# Patient Record
Sex: Female | Born: 1995 | Race: Black or African American | Hispanic: No | Marital: Single | Smoking: Current some day smoker
Health system: Southern US, Community
[De-identification: ages and names within clinical notes are randomized; demographics above are authoritative.]

## PROBLEM LIST (undated history)

## (undated) DIAGNOSIS — N946 Dysmenorrhea, unspecified: Secondary | ICD-10-CM

## (undated) HISTORY — DX: Dysmenorrhea, unspecified: N94.6

---
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
## 2017-05-21 ENCOUNTER — Ambulatory Visit (INDEPENDENT_AMBULATORY_CARE_PROVIDER_SITE_OTHER): Payer: Medicaid Other | Admitting: Family Medicine

## 2017-05-21 ENCOUNTER — Encounter: Payer: Self-pay | Admitting: Family Medicine

## 2017-05-21 VITALS — BP 108/84 | HR 85 | Temp 98.5°F | Resp 18 | Ht 67.0 in | Wt 137.9 lb

## 2017-05-21 DIAGNOSIS — R6889 Other general symptoms and signs: Secondary | ICD-10-CM

## 2017-05-21 DIAGNOSIS — R112 Nausea with vomiting, unspecified: Secondary | ICD-10-CM

## 2017-05-21 MED ORDER — FLUTICASONE PROPIONATE 50 MCG/ACT NA SUSP: 2.0000 | Freq: Every day | NASAL | 0 refills | Status: DC

## 2017-05-21 MED ORDER — ONDANSETRON HCL 4 MG PO TABS: 4.0000 mg | ORAL_TABLET | Freq: Three times a day (TID) | ORAL | 0 refills | Status: DC | PRN

## 2017-05-21 MED ORDER — FEXOFENADINE HCL 180 MG PO TABS: 180.0000 mg | ORAL_TABLET | Freq: Every day | ORAL | 0 refills | Status: DC

## 2017-05-21 MED ORDER — SALINE SPRAY 0.65 % NA SOLN: 1.0000 | NASAL | 0 refills | Status: DC | PRN

## 2017-05-21 NOTE — Progress Notes (Signed)
Name: Martha Mercado   MRN: [id number]    DOB: 11/07/1995   Date:05/21/2017       Progress Note  Subjective  Chief Complaint  Chief Complaint  Patient presents with  . URI    congested, headache, nausea for 1 week    HPI  Pt sts 5 days ago woke up from sleep with chills, body aches and severe nasal congestion. Sts the next day she felt better but then go significantly worse the next day. Pt endorses headache and severe nasal congestion- sts has to blow her nose often. Pt endorses nausea and vomiting started yesterday.  Presently denies body aches, fever or chills, eye drainage, blurry vision, itchy eyes, sneezing, ear ache, muffled sounds, diarrhea, sore throat or cough. Pt sts has been taking Nyquil with moderate relief of symptoms. Pt has positive sick contact- bf has been sick with flu-like symptoms 2 weeks ago.    Patient Active Problem List   Diagnosis Date Noted  . Blood in stool 08/15/2016  . Ovarian cyst, right 08/15/2016  . Chlamydia infection 12/29/2014    Social History   Tobacco Use  . Smoking status: Never Smoker  . Smokeless tobacco: Never Used  Substance Use Topics  . Alcohol use: No    Alcohol/week: 0.0 oz     Current Outpatient Medications:  .  acetaminophen (TYLENOL 8 HOUR) 650 MG CR tablet, Take 1 tablet (650 mg total) by mouth every 8 (eight) hours as needed for pain., Disp: 10 tablet, Rfl: 0  No Known Allergies  ROS  Constitutional: Negative for fever or weight change.  HEENT: See HPI Respiratory: Negative for cough and shortness of breath.   Cardiovascular: Negative for chest pain or palpitations.  Gastrointestinal: Positive for abdominal pain, nausea vomting, denies bowel changes.  Musculoskeletal: Negative for gait problem or joint swelling.  Skin: Negative for rash.  Neurological: Positive for headaches Negative for dizziness, neck stiffness.  No other specific complaints in a complete review of systems (except as listed in HPI  above).  Objective  Vitals:   05/21/17 1023  BP: 108/84  Pulse: 85  Resp: 18  Temp: 98.5 F (36.9 C)  TempSrc: Oral  SpO2: 98%  Weight: 137 lb 14.4 oz (62.6 kg)  Height: 5\' 7"  (1.702 m)     Body mass index is 21.6 kg/m.  Nursing Note and Vital Signs reviewed.  Physical Exam  Constitutional: Patient appears well-developed and well-nourished.  No distress.  HEENT: head atraumatic, normocephalic, pupils equal and reactive to light, EOM's intact, TM's without erythema or bulging, no maxillary or frontal sinus tenderness , neck supple without lymphadenopathy, oropharynx pink and moist without exudate Cardiovascular: Normal rate, regular rhythm, S1/S2 present.  No murmur or rub heard. No BLE edema. Pulmonary/Chest: Effort normal and breath sounds clear. No respiratory distress or retractions. Abdominal: Soft and non-tender, bowel sounds present x4 quadrants.  No CVA Tenderness  Psychiatric: Patient has a normal mood and affect. behavior is normal. Judgment and thought content normal.  No results found for this or any previous visit (from the past 72 hour(s)).  Assessment & Plan  There are no diagnoses linked to this encounter. 1. Flu-like symptoms - ondansetron (ZOFRAN) 4 MG tablet; Take 1 tablet (4 mg total) by mouth every 8 (eight) hours as needed for nausea or vomiting.  Dispense: 10 tablet; Refill: 0 - fluticasone (FLONASE) 50 MCG/ACT nasal spray; Place 2 sprays into both nostrils daily.  Dispense: 16 g; Refill: 0 - sodium chloride (OCEAN) 0.65 %  SOLN nasal spray; Place 1 spray into both nostrils as needed for congestion.  Dispense: 88 mL; Refill: 0 - fexofenadine (ALLEGRA ALLERGY) 180 MG tablet; Take 1 tablet (180 mg total) by mouth daily.  Dispense: 30 tablet; Refill: 0  2. Non-intractable vomiting with nausea, unspecified vomiting type - ondansetron (ZOFRAN) 4 MG tablet; Take 1 tablet (4 mg total) by mouth every 8 (eight) hours as needed for nausea or vomiting.  Dispense:  10 tablet; Refill: 0   Pt to alternate with OTC acetaminophen and ibuprofen as needed for pain and fever/chills. Zofran is prescribed for you to take is needed for nausea. Drink plenty of fluids and rest for the next 24-48 hours. Wash your hands thoroughly and cover your nose when sneezing to prevent spreading infection. If you are not feeling better or feel worse in the next few days call back in or follow up with Urgent Care.   -Red flags and when to present for emergency care or RTC including fever >101.27F, chest pain, shortness of breath, new/worsening/un-resolving symptoms, reviewed with patient at time of visit. Follow up and care instructions discussed and provided in AVS.

## 2017-05-21 NOTE — Patient Instructions (Addendum)
You can take acetaminophen and ibuprofen every 8 hours as needed for pain and fever/chills.  Zofran is prescribed for you to take is needed for nausea. Drink plenty of fluids and rest for the next 24-48 hours. Wash your hands thoroughly and cover your nose when sneezing to prevent spreading infection. If you are not feeling better or feel worse in the next few days call back in or follow up with Urgent Care.    Upper Respiratory Infection, Adult Most upper respiratory infections (URIs) are caused by a virus. A URI affects the nose, throat, and upper air passages. The most common type of URI is often called "the common cold." Follow these instructions at home:  Take medicines only as told by your doctor.  Gargle warm saltwater or take cough drops to comfort your throat as told by your doctor.  Use a warm mist humidifier or inhale steam from a shower to increase air moisture. This may make it easier to breathe.  Drink enough fluid to keep your pee (urine) clear or pale yellow.  Eat soups and other clear broths.  Have a healthy diet.  Rest as needed.  Go back to work when your fever is gone or your doctor says it is okay. ? You may need to stay home longer to avoid giving your URI to others. ? You can also wear a face mask and wash your hands often to prevent spread of the virus.  Use your inhaler more if you have asthma.  Do not use any tobacco products, including cigarettes, chewing tobacco, or electronic cigarettes. If you need help quitting, ask your doctor. Contact a doctor if:  You are getting worse, not better.  Your symptoms are not helped by medicine.  You have chills.  You are getting more short of breath.  You have brown or red mucus.  You have yellow or brown discharge from your nose.  You have pain in your face, especially when you bend forward.  You have a fever.  You have puffy (swollen) neck glands.  You have pain while swallowing.  You have white areas  in the back of your throat. Get help right away if:  You have very bad or constant: ? Headache. ? Ear pain. ? Pain in your forehead, behind your eyes, and over your cheekbones (sinus pain). ? Chest pain.  You have long-lasting (chronic) lung disease and any of the following: ? Wheezing. ? Long-lasting cough. ? Coughing up blood. ? A change in your usual mucus.  You have a stiff neck.  You have changes in your: ? Vision. ? Hearing. ? Thinking. ? Mood. This information is not intended to replace advice given to you by your health care provider. Make sure you discuss any questions you have with your health care provider. Document Released: 08/21/2007 Document Revised: 11/05/2015 Document Reviewed: 06/09/2013 [employer]vier Interactive Patient Education  2018 [employer]

## 2018-11-14 ENCOUNTER — Emergency Department
Admission: EM | Admit: 2018-11-14 | Discharge: 2018-11-14 | Disposition: A | Discharge status: Other Acute Inpatient Hospital | Payer: Self-pay | Attending: Emergency Medicine | Admitting: Emergency Medicine

## 2018-11-14 ENCOUNTER — Encounter: Payer: Self-pay | Admitting: Emergency Medicine

## 2018-11-14 ENCOUNTER — Other Ambulatory Visit: Payer: Self-pay

## 2018-11-14 ENCOUNTER — Inpatient Hospital Stay
Admission: RE | Admit: 2018-11-14 | Discharge: 2018-11-16 | DRG: 897 | Disposition: A | Discharge status: Home / Self Care | Payer: No Typology Code available for payment source | Source: Intra-hospital | Attending: Psychiatry | Admitting: Psychiatry

## 2018-11-14 DIAGNOSIS — Z20828 Contact with and (suspected) exposure to other viral communicable diseases: Secondary | ICD-10-CM | POA: Diagnosis present

## 2018-11-14 DIAGNOSIS — F15959 Other stimulant use, unspecified with stimulant-induced psychotic disorder, unspecified: Secondary | ICD-10-CM | POA: Diagnosis not present

## 2018-11-14 DIAGNOSIS — F1510 Other stimulant abuse, uncomplicated: Secondary | ICD-10-CM | POA: Diagnosis present

## 2018-11-14 DIAGNOSIS — F15159 Other stimulant abuse with stimulant-induced psychotic disorder, unspecified: Principal | ICD-10-CM | POA: Diagnosis present

## 2018-11-14 DIAGNOSIS — F332 Major depressive disorder, recurrent severe without psychotic features: Secondary | ICD-10-CM | POA: Diagnosis present

## 2018-11-14 DIAGNOSIS — Z79899 Other long term (current) drug therapy: Secondary | ICD-10-CM | POA: Insufficient documentation

## 2018-11-14 DIAGNOSIS — F1210 Cannabis abuse, uncomplicated: Secondary | ICD-10-CM | POA: Insufficient documentation

## 2018-11-14 DIAGNOSIS — R45851 Suicidal ideations: Secondary | ICD-10-CM | POA: Insufficient documentation

## 2018-11-14 LAB — COMPREHENSIVE METABOLIC PANEL
ALT: 15 U/L (ref 0–44)
AST: 20 U/L (ref 15–41)
Albumin: 4.6 g/dL (ref 3.5–5.0)
Alkaline Phosphatase: 45 U/L (ref 38–126)
Anion gap: 8 (ref 5–15)
BUN: 14 mg/dL (ref 6–20)
CO2: 26 mmol/L (ref 22–32)
Calcium: 9.3 mg/dL (ref 8.9–10.3)
Chloride: 105 mmol/L (ref 98–111)
Creatinine, Ser: 0.83 mg/dL (ref 0.44–1.00)
GFR calc Af Amer: >60 mL/min (ref >60)
GFR calc non Af Amer: >60 mL/min (ref >60)
Glucose, Bld: 80 mg/dL (ref 70–99)
Potassium: 3.6 mmol/L (ref 3.5–5.1)
Sodium: 139 mmol/L (ref 135–145)
Total Bilirubin: 1 mg/dL (ref 0.3–1.2)
Total Protein: 8.2 g/dL — ABNORMAL HIGH (ref 6.5–8.1)

## 2018-11-14 LAB — CBC
HCT: 42.7 % (ref 36.0–46.0)
Hemoglobin: 14.5 g/dL (ref 12.0–15.0)
MCH: 32.4 pg (ref 26.0–34.0)
MCHC: 34 g/dL (ref 30.0–36.0)
MCV: 95.5 fL (ref 80.0–100.0)
Platelets: 311 K/uL (ref 150–400)
RBC: 4.47 MIL/uL (ref 3.87–5.11)
RDW: 11.9 % (ref 11.5–15.5)
WBC: 4.6 K/uL (ref 4.0–10.5)
nRBC: 0 % (ref 0.0–0.2)

## 2018-11-14 LAB — ETHANOL: Alcohol, Ethyl (B): <10 mg/dL

## 2018-11-14 LAB — URINE DRUG SCREEN, QUALITATIVE (ARMC ONLY)
Amphetamines, Ur Screen: POSITIVE — AB
Barbiturates, Ur Screen: NOT DETECTED
Benzodiazepine, Ur Scrn: NOT DETECTED
Cannabinoid 50 Ng, Ur ~~LOC~~: POSITIVE — AB
Cocaine Metabolite,Ur ~~LOC~~: NOT DETECTED
MDMA (Ecstasy)Ur Screen: NOT DETECTED
Methadone Scn, Ur: NOT DETECTED
Opiate, Ur Screen: NOT DETECTED
Phencyclidine (PCP) Ur S: NOT DETECTED
Tricyclic, Ur Screen: NOT DETECTED

## 2018-11-14 LAB — SARS CORONAVIRUS 2 BY RT PCR (HOSPITAL ORDER, PERFORMED IN ~~LOC~~ HOSPITAL LAB): SARS Coronavirus 2: NEGATIVE

## 2018-11-14 LAB — ACETAMINOPHEN LEVEL: Acetaminophen (Tylenol), Serum: <10 ug/mL — ABNORMAL LOW (ref 10–30)

## 2018-11-14 LAB — SALICYLATE LEVEL: Salicylate Lvl: <7 mg/dL (ref 2.8–30.0)

## 2018-11-14 LAB — POCT PREGNANCY, URINE: Preg Test, Ur: NEGATIVE

## 2018-11-14 MED ORDER — ACETAMINOPHEN 325 MG PO TABS: 650.0000 mg | ORAL_TABLET | Freq: Four times a day (QID) | ORAL | Status: DC | PRN

## 2018-11-14 MED ORDER — GABAPENTIN 100 MG PO CAPS: 200.0000 mg | ORAL_CAPSULE | Freq: Three times a day (TID) | ORAL | Status: DC

## 2018-11-14 MED ORDER — MAGNESIUM HYDROXIDE 400 MG/5ML PO SUSP: 30.0000 mL | Freq: Every day | ORAL | Status: DC | PRN

## 2018-11-14 MED ORDER — ALUM & MAG HYDROXIDE-SIMETH 200-200-20 MG/5ML PO SUSP: 30.0000 mL | ORAL | Status: DC | PRN

## 2018-11-14 MED ORDER — GABAPENTIN 100 MG PO CAPS
200.0000 mg | ORAL_CAPSULE | Freq: Three times a day (TID) | ORAL | Status: DC
  Filled 2018-11-14: qty 2

## 2018-11-14 NOTE — Consult Note (Signed)
[facility] Face-to-Face Psychiatry Consult   Reason for Consult:  Bizarre behaviors Referring Physician:  EDP Patient Identification: Martha Mercado MRN:  [id number] Principal Diagnosis: Major depressive disorder, recurrent severe without psychotic features (HCC) Diagnosis:  Principal Problem:   Major depressive disorder, recurrent severe without psychotic features (HCC) Active Problems:   Methamphetamine abuse (HCC)  Total Time spent with patient: 1 hour  Subjective:   Martha Mercado is a 23 y.o. female patient admitted with bizarre behaviors.  "I'm OK.  I'm really freaking tired, I want to go to sleep."  Patient seen and evaluated by this provider.  The police stated she was "odd" in the shower prior to assessment.  Later, she was sitting on her bed eating.  Slightly irritable on assessment and did not want to talk.  She wanted to eat and sleep.  Reports she was at a party last night and doing meth.  She use to do it and stopped.  "I was doing meth and stuff."  Per IVC she stated she wanted to kill herself.  Scar on wrist from a previous attempt.  Told the EDP she had never gotten help but told the TTS person she was here (no record) but could have been here as an adolescent.  Minimizes her actions and does not confirm or deny her suicide threat.  She was thought blocking and having difficulty processing and answering questions.  Admission needed.  HPI per MD:  23 y.o. female been under IVC for suicidal and bizarre behavior.  Patient states that she does not currently want to commit suicide at this moment but she has had thoughts of in the past and she did have an attempt 3 years ago where she did cut her wrist.  She states that he did do meth yesterday.  She is very hesitant to discuss any other events that led to her IVC today but according to notes, she wanted to kill herself and was speaking in a way that was very concerning to the parents about a psychotic break  Past Psychiatric History:  substance abuse  Risk to Self:  yes Risk to Others:  none Prior Inpatient Therapy:  once Prior Outpatient Therapy:  none  Past Medical History:  Past Medical History:  Diagnosis Date  . Dysmenorrhea    History reviewed. No pertinent surgical history. Family History:  Family History  Problem Relation Age of Onset  . Hypertension Maternal Grandmother   . Hypertension Maternal Grandfather   . Diabetes Paternal Grandfather   . Heart disease Paternal Grandfather    Family Psychiatric  History:  None  Social History:  Social History   Substance and Sexual Activity  Alcohol Use No  . Alcohol/week: 0.0 standard drinks     Social History   Substance and Sexual Activity  Drug Use No    Social History   Socioeconomic History  . Marital status: Single    Spouse name: Not on file  . Number of children: Not on file  . Years of education: Not on file  . Highest education level: Not on file  Occupational History  . Not on file  Social Needs  . Financial resource strain: Not on file  . Food insecurity    Worry: Not on file    Inability: Not on file  . Transportation needs    Medical: Not on file    Non-medical: Not on file  Tobacco Use  . Smoking status: Never Smoker  . Smokeless tobacco: Never Used  Substance and Sexual Activity  . Alcohol use: No    Alcohol/week: 0.0 standard drinks  . Drug use: No  . Sexual activity: Yes    Birth control/protection: None  Lifestyle  . Physical activity    Days per week: Not on file    Minutes per session: Not on file  . Stress: Not on file  Relationships  . Social Herbalist on phone: Not on file    Gets together: Not on file    Attends religious service: Not on file    Active member of club or organization: Not on file    Attends meetings of clubs or organizations: Not on file    Relationship status: Not on file  Other Topics Concern  . Not on file  Social History Narrative  . Not on file   Additional Social  History:    Allergies:  No Known Allergies  Labs:  Results for orders placed or performed during the hospital encounter of 11/14/18 (from the past 48 hour(s))  Comprehensive metabolic panel     Status: Abnormal   Collection Time: 11/14/18  4:16 PM  Result Value Ref Range   Sodium 139 135 - 145 mmol/L   Potassium 3.6 3.5 - 5.1 mmol/L   Chloride 105 98 - 111 mmol/L   CO2 26 22 - 32 mmol/L   Glucose, Bld 80 70 - 99 mg/dL   BUN 14 6 - 20 mg/dL   Creatinine, Ser 0.83 0.44 - 1.00 mg/dL   Calcium 9.3 8.9 - 10.3 mg/dL   Total Protein 8.2 (H) 6.5 - 8.1 g/dL   Albumin 4.6 3.5 - 5.0 g/dL   AST 20 15 - 41 U/L   ALT 15 0 - 44 U/L   Alkaline Phosphatase 45 38 - 126 U/L   Total Bilirubin 1.0 0.3 - 1.2 mg/dL   GFR calc non Af Amer >60 >60 mL/min   GFR calc Af Amer >60 >60 mL/min   Anion gap 8 5 - 15    Comment: Performed at [facility], [address] [place], [place] [address]  Ethanol     Status: None   Collection Time: 11/14/18  4:16 PM  Result Value Ref Range   Alcohol, Ethyl (B) <10 <10 mg/dL    Comment: (NOTE) Lowest detectable limit for serum alcohol is 10 mg/dL. For medical purposes only. Performed at [facility], [address] [place], [place] [address]   Salicylate level     Status: None   Collection Time: 11/14/18  4:16 PM  Result Value Ref Range   Salicylate Lvl <5.4 2.8 - 30.0 mg/dL    Comment: Performed at [facility], [address] [place], [place] [address]  Acetaminophen level     Status: Abnormal   Collection Time: 11/14/18  4:16 PM  Result Value Ref Range   Acetaminophen (Tylenol), Serum <10 (L) 10 - 30 ug/mL    Comment: (NOTE) Therapeutic concentrations vary significantly. A range of 10-30 ug/mL  may be an effective concentration for many patients. However, some  are best treated at concentrations outside of this range. Acetaminophen concentrations >150 ug/mL at 4 hours after ingestion  and >50 ug/mL at 12 hours after  ingestion are often associated with  toxic reactions. Performed at [facility], [address] [place],  [address]   cbc     Status: None   Collection Time: 11/14/18  4:16 PM  Result Value Ref Range   WBC  4.6 4.0 - 10.5 K/uL   RBC 4.47 3.87 - 5.11 MIL/uL   Hemoglobin 14.5 12.0 - 15.0 g/dL   HCT 16.142.7 09.636.0 - 04.546.0 %   MCV 95.5 80.0 - 100.0 fL   MCH 32.4 26.0 - 34.0 pg   MCHC 34.0 30.0 - 36.0 g/dL   RDW 40.911.9 81.111.5 - 91.415.5 %   Platelets 311 150 - 400 K/uL   nRBC 0.0 0.0 - 0.2 %    Comment: Performed at [facility], [address] [place], [place] [address]    No current facility-administered medications for this encounter.    Current Outpatient Medications  Medication Sig Dispense Refill  . acetaminophen (TYLENOL 8 HOUR) 650 MG CR tablet Take 1 tablet (650 mg total) by mouth every 8 (eight) hours as needed for pain. 10 tablet 0  . fexofenadine (ALLEGRA ALLERGY) 180 MG tablet Take 1 tablet (180 mg total) by mouth daily. 30 tablet 0  . fluticasone (FLONASE) 50 MCG/ACT nasal spray Place 2 sprays into both nostrils daily. 16 g 0  . ondansetron (ZOFRAN) 4 MG tablet Take 1 tablet (4 mg total) by mouth every 8 (eight) hours as needed for nausea or vomiting. 10 tablet 0  . sodium chloride (OCEAN) 0.65 % SOLN nasal spray Place 1 spray into both nostrils as needed for congestion. 88 mL 0    Musculoskeletal: Strength & Muscle Tone: within normal limits Gait & Station: normal Patient leans: N/A  Psychiatric Specialty Exam: Physical Exam  Nursing note and vitals reviewed. Constitutional: She appears well-developed and well-nourished.  HENT:  Head: Normocephalic.  Neck: Normal range of motion.  Respiratory: Effort normal.  Musculoskeletal: Normal range of motion.  Neurological: She is alert.  Psychiatric: Her speech is normal. Her mood appears anxious. Her affect is blunt. She is actively hallucinating. Thought content is paranoid. Cognition and memory are  impaired. She expresses impulsivity and inappropriate judgment. She exhibits a depressed mood. She is inattentive.    Review of Systems  Constitutional: Positive for malaise/fatigue.  Psychiatric/Behavioral: Positive for depression, memory loss and substance abuse. The patient is nervous/anxious.   All other systems reviewed and are negative.   Blood pressure 133/71, pulse 82, temperature 98.3 F (36.8 C), temperature source Oral, resp. rate 20, height 5\' 7"  (1.702 m), weight 59 kg, SpO2 99 %.Body mass index is 20.36 kg/m.  General Appearance: Casual  Eye Contact:  Fair  Speech:  Blocked and Slow  Volume:  Normal  Mood:  Anxious, Depressed and Irritable  Affect:  Blunt  Thought Process:  Disorganized at times  Orientation:  Other:  self and place  Thought Content:  Hallucinations: Visual and Paranoid Ideation  Suicidal Thoughts:  No  Homicidal Thoughts:  No  Memory:  Immediate;   Poor Recent;   Poor Remote;   Fair  Judgement:  Impaired  Insight:  Lacking  Psychomotor Activity:  Normal  Concentration:  Concentration: Poor and Attention Span: Poor  Recall:  Poor  Fund of Knowledge:  Fair  Language:  Fair  Akathisia:  No  Handed:  Right  AIMS (if indicated):     Assets:  Housing Intimacy Leisure Time Physical Health Resilience Social Support  ADL's:  Intact  Cognition:  Impaired,  Moderate  Sleep:        Treatment Plan Summary: Daily contact with patient to assess and evaluate symptoms and progress in treatment, Medication management and Plan major depressive disorder, recurrent, severe without psychosis:  -[facility]  Methamphetamine  abuse: -Started gabapentin 200 mg TID  Disposition: Recommend psychiatric Inpatient admission when medically cleared.  Nanine MeansJamison , NP 11/14/2018 5:37 PM

## 2018-11-14 NOTE — ED Notes (Signed)
TTS called for room assignment - no answer att

## 2018-11-14 NOTE — ED Triage Notes (Signed)
Patient arrives IVC. States she does not want anything done and states no when questioned if can do vitals but does not resist when taken. Arrives with IVC paperwork describing bizarre behavior. Speech is clear.

## 2018-11-14 NOTE — BH Assessment (Addendum)
Assessment Note  Martha OverlyHannah A Mercado is an 23 y.o. female who presents to ED under involuntary commitment after being petitioned by her family. Pt reports she recently used meth "last night". Pt was tangential in her speech while somewhat irritable in her mood. She also exhibited thought blocking behaviors while speaking with this Clinical research associatewriter. She kept repeating that she wanted to go to sleep. Pt states "I don't feel like I need to be here". Pt denied medications; however, she was unclear if she received inpatient treatment in the past. Pt denied SI/HI/AVH.   An attempt to gain collateral from patient's family was unsuccessful - mother: Loletha Carrowngela Armato - [phone]- [phone].  Diagnosis: Amphetamine Use Disorder, unspecified  Past Medical History:  Past Medical History:  Diagnosis Date  . Dysmenorrhea     History reviewed. No pertinent surgical history.  Family History:  Family History  Problem Relation Age of Onset  . Hypertension Maternal Grandmother   . Hypertension Maternal Grandfather   . Diabetes Paternal Grandfather   . Heart disease Paternal Grandfather     Social History:  reports that she has never smoked. She has never used smokeless tobacco. She reports current drug use. Drugs: Cocaine, Marijuana, and Methamphetamines. She reports that she does not drink alcohol.  Additional Social History:  Alcohol / Drug Use Pain Medications: See MAR Prescriptions: See MAR Over the Counter: See MAR History of alcohol / drug use?: Yes Longest period of sobriety (when/how long): [facility] Negative Consequences of Use: (Denied) Withdrawal Symptoms: Agitation, Irritability Substance #1 Name of Substance 1: Crystal Meth 1 - Age of First Use: Unable to quantify 1 - Amount (size/oz): Unable to quantify 1 - Frequency: Unable to quantify 1 - Duration: Unable to quantify 1 - Last Use / Amount: "last night" - 11/13/2018  CIWA: CIWA-Ar BP: 114/66 Pulse Rate: (!) 45 COWS:    Allergies: No Known  Allergies  Home Medications: (Not in a hospital admission)   OB/GYN Status:  No LMP recorded.  General Assessment Data [facility] Assessment: In system Is this a Tele or Face-to-Face Assessment?: Face-to-Face Is this an Initial Assessment or a Re-assessment for this encounter?: Initial Assessment Patient Accompanied by:: N/A Language Other than English: No Living Arrangements: Other (Comment)(Private Residence) What gender do you identify as?: Female Marital status: Single Maiden name: N/A Pregnancy Status: No Living Arrangements: Spouse/significant other Can pt return to current living arrangement?: Yes Admission Status: Involuntary Petitioner: Family member Is patient capable of signing voluntary admission?: No Referral Source: Self/Family/Friend Insurance type: None Multimedia programmerListed  Medical Screening Exam ([facility] Walk-in ONLY) Medical Exam completed: Yes  Crisis Care Plan Living Arrangements: Spouse/significant other Legal Guardian: Other:(Self) Name of Psychiatrist: None Reported Name of Therapist: None Reported  Education Status Is patient currently in school?: No Is the patient employed, unemployed or receiving disability?: Unemployed  Risk to self with the past 6 months Suicidal Ideation: No Has patient been a risk to self within the past 6 months prior to admission? : No Suicidal Intent: No Has patient had any suicidal intent within the past 6 months prior to admission? : No Is patient at risk for suicide?: No Suicidal Plan?: No Has patient had any suicidal plan within the past 6 months prior to admission? : No Access to Means: No What has been your use of drugs/alcohol within the last 12 months?: Crystal Meth Previous Attempts/Gestures: Yes How many times?: 1 Other Self Harm Risks: None Triggers for Past Attempts: None known Intentional Self Injurious Behavior:  Cutting Comment - Self Injurious Behavior: Hx of cutting wrist Family Suicide  History: Unknown Recent stressful life event(s): ([facility]) Persecutory voices/beliefs?: No Depression: No Depression Symptoms: (None Reported) Substance abuse history and/or treatment for substance abuse?: Yes Suicide prevention information given to non-admitted patients: Not applicable  Risk to Others within the past 6 months Homicidal Ideation: No Does patient have any lifetime risk of violence toward others beyond the six months prior to admission? : No Thoughts of Harm to Others: No Current Homicidal Intent: No Current Homicidal Plan: No Access to Homicidal Means: No Identified Victim: None History of harm to others?: No Assessment of Violence: None Noted Violent Behavior Description: None Does patient have access to weapons?: No Criminal Charges Pending?: No Does patient have a court date: No Is patient on probation?: No  Psychosis Hallucinations: None noted Delusions: None noted  Mental Status Report Appearance/Hygiene: In scrubs Eye Contact: Fair Motor Activity: Freedom of movement Speech: Tangential Level of Consciousness: Irritable Mood: Ambivalent, Irritable Affect: Irritable Anxiety Level: Minimal Thought Processes: Tangential Judgement: Impaired Orientation: Person, Place, Situation, Time Obsessive Compulsive Thoughts/Behaviors: None  Cognitive Functioning Concentration: Normal Memory: Recent Impaired Is patient IDD: No Insight: see judgement above Impulse Control: Poor Appetite: Good Have you had any weight changes? : No Change Sleep: No Change Total Hours of Sleep: 6 Vegetative Symptoms: None  ADLScreening [facility] Assessment Services) Patient's cognitive ability adequate to safely complete daily activities?: Yes Patient able to express need for assistance with ADLs?: Yes Independently performs ADLs?: Yes (appropriate for developmental age)  Prior Inpatient Therapy Prior Inpatient Therapy: No  Prior Outpatient Therapy Prior Outpatient Therapy:  No Does patient have an ACCT team?: No Does patient have Intensive In-House Services?  : No Does patient have [facility] services? : No Does patient have P4CC services?: No  ADL Screening (condition at time of admission) Patient's cognitive ability adequate to safely complete daily activities?: Yes Patient able to express need for assistance with ADLs?: Yes Independently performs ADLs?: Yes (appropriate for developmental age)       Abuse/Neglect Assessment (Assessment to be complete while patient is alone) Abuse/Neglect Assessment Can Be Completed: Yes Physical Abuse: Denies Verbal Abuse: Denies Sexual Abuse: Denies Exploitation of patient/patient's resources: Denies Self-Neglect: Denies Values / Beliefs Cultural Requests During Hospitalization: None Spiritual Requests During Hospitalization: None Consults Spiritual Care Consult Needed: No Social Work Consult Needed: No Regulatory affairs officer (For Healthcare) Does Patient Have a Medical Advance Directive?: No       Child/Adolescent Assessment Running Away Risk: (Patient is an adult)  Disposition:  Disposition Initial Assessment Completed for this Encounter: Yes Disposition of Patient: Admit Type of inpatient treatment program: Adult Patient refused recommended treatment: No Mode of transportation if patient is discharged/movement?: N/A Patient referred to: Other (Comment)([facility])  On Site Evaluation by:   Reviewed with Physician:    Frederich Cha 11/14/2018 7:23 PM

## 2018-11-14 NOTE — Tx Team (Signed)
Initial Treatment Plan 11/14/2018 11:53 PM TESHARA MOREE QZE:[id number]    PATIENT STRESSORS: Substance abuse   PATIENT STRENGTHS: Average or above average intelligence Physical Health Supportive family/friends   PATIENT IDENTIFIED PROBLEMS:   Suicidal ideation  Substance abuse                 DISCHARGE CRITERIA:  Ability to meet basic life and health needs Adequate post-discharge living arrangements Improved stabilization in mood, thinking, and/or behavior Medical problems require only outpatient monitoring Motivation to continue treatment in a less acute level of care Need for constant or close observation no longer present Reduction of life-threatening or endangering symptoms to within safe limits Safe-care adequate arrangements made Verbal commitment to aftercare and medication compliance  PRELIMINARY DISCHARGE PLAN: Outpatient therapy Return to previous living arrangement  PATIENT/FAMILY INVOLVEMENT: This treatment plan has been presented to and reviewed with the patient, Martha Mercado, and/or family member.  The patient and family have been given the opportunity to ask questions and make suggestions.  Libby Maw, RN 11/14/2018, 11:53 PM

## 2018-11-14 NOTE — ED Notes (Signed)
Call to TTS

## 2018-11-14 NOTE — ED Provider Notes (Signed)
[facility] Emergency Department Provider Note  ____________________________________________   I have reviewed the triage vital signs and the nursing notes. Where available I have reviewed prior notes and, if possible and indicated, outside hospital notes.   Patient seen and evaluated during the coronavirus epidemic during a time with low staffing  Patient seen for the symptoms described in the history of present illness. She was evaluated in the context of the global COVID-19 pandemic, which necessitated consideration that the patient might be at risk for infection with the SARS-CoV-2 virus that causes COVID-19. Institutional protocols and algorithms that pertain to the evaluation of patients at risk for COVID-19 are in a state of rapid change based on information released by regulatory bodies including the CDC and federal and state organizations. These policies and algorithms were followed during the patient's care in the ED.    HISTORY  Chief Complaint Psychiatric Evaluation    HPI Martha Mercado is a 23 y.o. female been under IVC for suicidal and bizarre behavior.  Patient states that she does not currently want to commit suicide at this moment but she has had thoughts of in the past and she did have an attempt 3 years ago where she did cut her wrist.  She states that he did do meth yesterday.  She is very hesitant to discuss any other events that led to her IVC today but according to notes, she wanted to kill herself and was speaking in a way that was very concerning to the parents about a psychotic break      Past Medical History:  Diagnosis Date  . Dysmenorrhea     Patient Active Problem List   Diagnosis Date Noted  . Blood in stool 08/15/2016  . Ovarian cyst, right 08/15/2016  . Chlamydia infection 12/29/2014    History reviewed. No pertinent surgical history.  Prior to Admission medications   Medication Sig Start Date End Date Taking?  Authorizing Provider  acetaminophen (TYLENOL 8 HOUR) 650 MG CR tablet Take 1 tablet (650 mg total) by mouth every 8 (eight) hours as needed for pain. 08/21/16   Doren Custard, FNP  fexofenadine (ALLEGRA ALLERGY) 180 MG tablet Take 1 tablet (180 mg total) by mouth daily. 05/21/17   Doren Custard, FNP  fluticasone (FLONASE) 50 MCG/ACT nasal spray Place 2 sprays into both nostrils daily. 05/21/17   Doren Custard, FNP  ondansetron (ZOFRAN) 4 MG tablet Take 1 tablet (4 mg total) by mouth every 8 (eight) hours as needed for nausea or vomiting. 05/21/17   Doren Custard, FNP  sodium chloride (OCEAN) 0.65 % SOLN nasal spray Place 1 spray into both nostrils as needed for congestion. 05/21/17   Doren Custard, FNP    Allergies Patient has no known allergies.  Family History  Problem Relation Age of Onset  . Hypertension Maternal Grandmother   . Hypertension Maternal Grandfather   . Diabetes Paternal Grandfather   . Heart disease Paternal Grandfather     Social History Social History   Tobacco Use  . Smoking status: Never Smoker  . Smokeless tobacco: Never Used  Substance Use Topics  . Alcohol use: No    Alcohol/week: 0.0 standard drinks  . Drug use: No    Review of Systems Constitutional: No fever/chills Eyes: No visual changes. ENT: No sore throat. No stiff neck no neck pain Cardiovascular: Denies chest pain. Respiratory: Denies shortness of breath. Gastrointestinal:   no vomiting.  No diarrhea.  No constipation. Genitourinary:  Negative for dysuria. Musculoskeletal: Negative lower extremity swelling Skin: Negative for rash. Neurological: Negative for severe headaches, focal weakness or numbness.   ____________________________________________   PHYSICAL EXAM:  VITAL SIGNS: ED Triage Vitals  Enc Vitals Group     BP 11/14/18 1612 133/71     Pulse Rate 11/14/18 1612 82     Resp 11/14/18 1612 20     Temp 11/14/18 1612 98.3 F (36.8 C)     Temp Source 11/14/18 1612 Oral     SpO2  11/14/18 1612 99 %     Weight 11/14/18 1614 130 lb (59 kg)     Height 11/14/18 1614 5\' 7"  (1.702 m)     Head Circumference --      Peak Flow --      Pain Score 11/14/18 1614 0     Pain Loc --      Pain Edu? --      Excl. in [place]? --    Female nurse chaperone present for the entire exam Constitutional: Alert and oriented. Well appearing and in no acute distress. Eyes: Conjunctivae are normal Head: Atraumatic HEENT: No congestion/rhinnorhea. Mucous membranes are moist.  Oropharynx non-erythematous Neck:   Nontender with no meningismus, no masses, no stridor Cardiovascular: Normal rate, regular rhythm. Grossly normal heart sounds.  Good peripheral circulation. Respiratory: Normal respiratory effort.  No retractions. Lungs CTAB. Abdominal: Soft and nontender. No distention. No guarding no rebound Back:  There is no focal tenderness or step off.   Musculoskeletal: No lower extremity tenderness, no upper extremity tenderness. No joint effusions, no DVT signs strong distal pulses no edema Neurologic:  Normal speech and language. No gross focal neurologic deficits are appreciated.  Skin:  Skin is warm, dry and intact. No rash noted.  Old healed scar on left wrist noted Psychiatric: Mood and affect are normal. Speech and behavior are normal.  ____________________________________________   LABS (all labs ordered are listed, but only abnormal results are displayed)  Labs Reviewed  COMPREHENSIVE METABOLIC PANEL - Abnormal; Notable for the following components:      Result Value   Total Protein 8.2 (*)    All other components within normal limits  ETHANOL  CBC  SALICYLATE LEVEL  ACETAMINOPHEN LEVEL  URINE DRUG SCREEN, QUALITATIVE ([facility] ONLY)  POC URINE PREG, ED    Pertinent labs  results that were available during my care of the patient were reviewed by me and considered in my medical decision making (see chart for details). ____________________________________________  EKG  I  personally interpreted any EKGs ordered by me or triage  ____________________________________________  RADIOLOGY  Pertinent labs & imaging results that were available during my care of the patient were reviewed by me and considered in my medical decision making (see chart for details). If possible, patient and/or family made aware of any abnormal findings.  No results found. ____________________________________________    PROCEDURES  Procedure(s) performed: None  Procedures  Critical Care performed: None  ____________________________________________   INITIAL IMPRESSION / ASSESSMENT AND PLAN / ED COURSE  Pertinent labs & imaging results that were available during my care of the patient were reviewed by me and considered in my medical decision making (see chart for details).   Patient here with SI and bizarre behavior to her which she states is the use of meth and alcohol yesterday.  She denies SI or HI at this time although she states she has had suicidal thoughts in the past and she reluctantly admits that she did try to  kill her self once.  She is under IVC.  Pending psychiatric evaluation.  No evidence of toxidrome or admitted overdose    ____________________________________________   FINAL CLINICAL IMPRESSION(S) / ED DIAGNOSES  Final diagnoses:  None      This chart was dictated using voice recognition software.  Despite best efforts to proofread,  errors can occur which can change meaning.      Jeanmarie PlantMcShane,  A, MD 11/14/18 [phone]

## 2018-11-14 NOTE — ED Notes (Addendum)
Patient's Mother, Father, and Sister arrived to ED to visit patient.  informed that visiting hours ended at 1800, and told them what visitation hours are for the [facility] area in the ED.  Asked patient for permission to speak with family, patient declined and asked that she speak with them on the phone.  Phone call with patient offered to family.  They declined and stated they wanted to speak with a physician.  Offered to take their contact number, but family left hospital.

## 2018-11-14 NOTE — ED Notes (Addendum)
Pt sitting on bed smacking her hands and hitting bed, talking quickly and loudly, reports "nobody has told me anything, this isn't right, I need my phone, why am I here?"  Pt oriented to POC, IVC and admission, pt repeated interrupting, and reports "they never told me anything", pt advised to listen, be honest with care providers and attempt to make the best of admission, plan for care discussed again and pt more receptive and more calm

## 2018-11-14 NOTE — ED Notes (Signed)
TTS called for admission info - no answer att

## 2018-11-14 NOTE — ED Notes (Signed)
Pt on bed watching tv, given ice water

## 2018-11-14 NOTE — ED Notes (Signed)
Dressed in scrubs, shirt, undershirt, knit pants and clogs in belongings. Officers here with IVC paperwork. Patient unable to provide urine at this time.
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
## 2018-11-14 NOTE — ED Notes (Addendum)
meds not available in pyxis, pt given meal tray and fluids as requested   POC discussed with pt

## 2018-11-14 NOTE — ED Notes (Addendum)
No answer on TTS cell phone, contact with [facility] in [place] - no TTS on staff for [facility] tonight

## 2018-11-14 NOTE — ED Notes (Signed)
Patient requesting to take a shower.  Set up for shower.

## 2018-11-14 NOTE — ED Notes (Signed)
Pt to go to room # 314 Dr Rondel Jumbo at 2210, BMU receiving another pt att
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
## 2018-11-15 DIAGNOSIS — F15959 Other stimulant use, unspecified with stimulant-induced psychotic disorder, unspecified: Secondary | ICD-10-CM

## 2018-11-15 MED ORDER — GABAPENTIN 300 MG PO CAPS
300.0000 mg | ORAL_CAPSULE | Freq: Three times a day (TID) | ORAL | Status: DC
  Administered 2018-11-15 – 2018-11-16 (×3): 300 mg via ORAL
  Filled 2018-11-15 (×3): qty 1

## 2018-11-15 MED ORDER — PRENATAL PLUS 27-1 MG PO TABS
1.0000 | ORAL_TABLET | Freq: Every day | ORAL | Status: DC
  Administered 2018-11-15 – 2018-11-16 (×2): 1 via ORAL
  Filled 2018-11-15 (×2): qty 1
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
## 2018-11-15 NOTE — BHH Suicide Risk Assessment (Signed)
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  World Pharmacologist [facility]). Score between 0-7:  no or low risk or alcohol related problems. Score between 8-15:  moderate risk of alcohol related problems. Score between 16-19:  high risk of alcohol related problems. Score 20 or above:  warrants further diagnostic evaluation for alcohol dependence and treatment.   CLINICAL FACTORS:   Alcohol/Substance Abuse/Dependencies   Musculoskeletal: Strength & Muscle Tone: within normal limits Gait & Station: normal Patient leans: N/A  Psychiatric Specialty Exam: Physical Exam  Nursing note and vitals reviewed. Constitutional: She appears well-developed and well-nourished.    Review of Systems  Constitutional: Negative.   Eyes: Negative.   Gastrointestinal: Negative.   Neurological: Negative.   Endo/Heme/Allergies: Negative.     Blood pressure 95/81, pulse 88, temperature 98.3 F (36.8 C), temperature source Oral, resp. rate 18, height 5\' 7"  (1.702 m), weight 55.8 kg, SpO2 100 %.Body mass index is 19.26 kg/m.   General Appearance: Casual  Eye Contact:  Good  Speech:  Clear and Coherent  Volume:  Normal  Mood:  Euthymic  Affect:  Appropriate and Congruent  Thought Process:  Coherent and Linear  Orientation:  Full (Time, Place, and Person)  Thought Content:  Logical  Suicidal Thoughts:  No  Homicidal Thoughts:  No  Memory:  Immediate;   Fair Recent;   Fair  Judgement:  Good  Insight:  Fair  Psychomotor Activity:  Normal  Concentration:  Concentration: Good  Recall:  Good  Fund of Knowledge:  Good  Language:  Good  Akathisia:  Negative  Handed:  Right  AIMS (if indicated):     Assets:  Leisure Time Physical Health  ADL's:  Intact  Cognition:  WNL  Sleep:  Number of Hours: 6.5     COGNITIVE FEATURES THAT CONTRIBUTE TO RISK:  Polarized thinking    SUICIDE RISK:   Minimal: No identifiable suicidal ideation.  Patients presenting with no risk factors but with morbid ruminations; may be classified as minimal risk based on the severity of the depressive symptoms  PLAN OF CARE: Admit for stabilization probable discharge tomorrow  I certify that inpatient services furnished can reasonably be expected to improve the patient's condition.   Johnn Hai, MD 11/15/2018, 10:42 AM

## 2018-11-15 NOTE — H&P (Signed)
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 Blood alcohol level 180 again drug screen showing methamphetamines and cannabis.  Patient knowledge is chronic cannabis abuse and using methamphetamine "for the last few months" when in [place] and acknowledges that it has made her psychotic before.  By 8/29 she was displaying some disruptive behaviors making some delusional statements that she was "kidnapped" was angry and labile and poorly cooperative. Today however she is much clearer  She is alert oriented fully and cooperative affect is appropriate for the situation mood is euthymic and stable she denies current thoughts of harming self or others acknowledge the past attempted cutting her wrist 3 years ago.  She denies taking any psychiatric medications at home.  Again, she denies any current auditory or visual hallucinations.  Associated Signs/Symptoms: Depression Symptoms:  insomnia, (Hypo) Manic Symptoms:  Delusions, Anxiety Symptoms:  Panic Symptoms, Psychotic Symptoms:  Hallucinations: Auditory PTSD Symptoms: NA Total Time spent with patient: 45 minutes  Past Psychiatric History: Reports a history of psychosis in the past when using methamphetamines but continued to use  Is the patient at risk to self? Yes.    Has the patient been a risk to self in the past 6  months? No.  Has the patient been a risk to self within the distant past?  3 years ago by report Is the patient a risk to others? No.  Has the patient been a risk to others in the past 6 months? No.  Has the patient been a risk to others within the distant past? No.   Prior Inpatient Therapy:  Not reported  prior Outpatient Therapy:  None reported  Alcohol Screening: 1. How often do you have a drink containing alcohol?: Never 2. How many drinks containing alcohol do you have on a typical day when you are drinking?: 1 or 2 3. How often do you have six or more drinks on one occasion?: Never AUDIT-C Score: 0 4. How often during the last year have you found that you were not able to stop drinking once you had started?: Never 5. How often during the last year have you failed to do what was normally expected from you becasue of drinking?: Never 6. How often during the last year have you needed a first drink in the morning to get yourself going after a heavy drinking session?: Never 7. How often during the last year have you had a feeling of guilt of remorse after drinking?: Never 8. How often during the last year have you been unable to remember what happened the night before because you had been drinking?: Never 9. Have you or someone else been injured as a result of your drinking?: No 10. Has a relative or friend or a doctor or another health worker been concerned about your drinking or suggested you cut down?: No Alcohol Use Disorder Identification Test Final Score (AUDIT): 0 Alcohol Brief Interventions/Follow-up: Brief Advice Substance Abuse History in the last 12 months:  Yes.   Consequences of  Substance Abuse: Medical Consequences:  Substance-induced psychosis Previous Psychotropic Medications: No  Psychological Evaluations: No  Past Medical History:  Past Medical History:  Diagnosis Date  . Dysmenorrhea    History reviewed. No pertinent surgical history. Family History:  Family  History  Problem Relation Age of Onset  . Hypertension Maternal Grandmother   . Hypertension Maternal Grandfather   . Diabetes Paternal Grandfather   . Heart disease Paternal Grandfather    Family Psychiatric  History: Denies Tobacco Screening: Have you used any form of tobacco in the last 30 days? (Cigarettes, Smokeless Tobacco, Cigars, and/or Pipes): Yes Tobacco use, Select all that apply: 4 or less cigarettes per day Are you interested in Tobacco Cessation Medications?: Yes, will notify MD for an order Counseled patient on smoking cessation including recognizing danger situations, developing coping skills and basic information about quitting provided: Refused/Declined practical counseling Social History:  Social History   Substance and Sexual Activity  Alcohol Use No  . Alcohol/week: 0.0 standard drinks     Social History   Substance and Sexual Activity  Drug Use Yes  . Types: Cocaine, Marijuana, Methamphetamines    Additional Social History:                           Allergies:  No Known Allergies Lab Results:  Results for orders placed or performed during the hospital encounter of 11/14/18 (from the past 48 hour(s))  Comprehensive metabolic panel     Status: Abnormal   Collection Time: 11/14/18  4:16 PM  Result Value Ref Range   Sodium 139 135 - 145 mmol/L   Potassium 3.6 3.5 - 5.1 mmol/L   Chloride 105 98 - 111 mmol/L   CO2 26 22 - 32 mmol/L   Glucose, Bld 80 70 - 99 mg/dL   BUN 14 6 - 20 mg/dL   Creatinine, Ser 4.09 0.44 - 1.00 mg/dL   Calcium 9.3 8.9 - 81.1 mg/dL   Total Protein 8.2 (H) 6.5 - 8.1 g/dL   Albumin 4.6 3.5 - 5.0 g/dL   AST 20 15 - 41 U/L   ALT 15 0 - 44 U/L   Alkaline Phosphatase 45 38 - 126 U/L   Total Bilirubin 1.0 0.3 - 1.2 mg/dL   GFR calc non Af Amer >60 >60 mL/min   GFR calc Af Amer >60 >60 mL/min   Anion gap 8 5 - 15    Comment: Performed at [facility], [address] [place], [place] [address]  Ethanol      Status: None   Collection Time: 11/14/18  4:16 PM  Result Value Ref Range   Alcohol, Ethyl (B) <10 <10 mg/dL    Comment: (NOTE) Lowest detectable limit for serum alcohol is 10 mg/dL. For medical purposes only. Performed at [facility], [address] [place], [place] [address]   Salicylate level     Status: None   Collection Time: 11/14/18  4:16 PM  Result Value Ref Range   Salicylate Lvl <7.0 2.8 - 30.0 mg/dL    Comment: Performed at [facility] [place], [address] [place], [place] [address]  Acetaminophen level     Status: Abnormal   Collection Time: 11/14/18  4:16 PM  Result Value Ref Range   Acetaminophen (Tylenol), Serum <10 (L) 10 - 30 ug/mL    Comment: (NOTE) Therapeutic concentrations vary significantly. A range of 10-30 ug/mL  may be an effective concentration for many patients. However, some  are best treated at concentrations outside of this range. Acetaminophen concentrations >150 ug/mL at 4 hours after ingestion  and >50 ug/mL at 12 hours after ingestion are often associated with  toxic reactions. Performed at [facility], [address] [place], [place] [address]   cbc     Status: None   Collection Time: 11/14/18  4:16 PM  Result Value Ref Range   WBC 4.6 4.0 - 10.5 K/uL   RBC 4.47 3.87 - 5.11 MIL/uL   Hemoglobin 14.5 12.0 - 15.0 g/dL   HCT 88.142.7 10.336.0 - 15.946.0 %   MCV 95.5 80.0 - 100.0 fL   MCH 32.4 26.0 - 34.0 pg   MCHC 34.0 30.0 - 36.0 g/dL   RDW 45.811.9 59.211.5 - 92.415.5 %   Platelets 311 150 - 400 K/uL   nRBC 0.0 0.0 - 0.2 %    Comment: Performed at [facility], [address] [place], [place] [address]  Urine Drug Screen, Qualitative     Status: Abnormal   Collection Time: 11/14/18  4:16 PM  Result Value Ref Range   Tricyclic, Ur Screen NONE DETECTED NONE DETECTED   Amphetamines, Ur Screen POSITIVE (A) NONE DETECTED   MDMA (Ecstasy)Ur Screen NONE DETECTED NONE DETECTED   Cocaine Metabolite,Ur [place] NONE DETECTED NONE  DETECTED   Opiate, Ur Screen NONE DETECTED NONE DETECTED   Phencyclidine (PCP) Ur S NONE DETECTED NONE DETECTED   Cannabinoid 50 Ng, Ur [place] POSITIVE (A) NONE DETECTED   Barbiturates, Ur Screen NONE DETECTED NONE DETECTED   Benzodiazepine, Ur Scrn NONE DETECTED NONE DETECTED   Methadone Scn, Ur NONE DETECTED NONE DETECTED    Comment: (NOTE) Tricyclics + metabolites, urine    Cutoff 1000 ng/mL Amphetamines + metabolites, urine  Cutoff 1000 ng/mL MDMA (Ecstasy), urine              Cutoff 500 ng/mL Cocaine Metabolite, urine          Cutoff 300 ng/mL Opiate + metabolites, urine        Cutoff 300 ng/mL Phencyclidine (PCP), urine         Cutoff 25 ng/mL Cannabinoid, urine                 Cutoff 50 ng/mL Barbiturates + metabolites, urine  Cutoff 200 ng/mL Benzodiazepine, urine              Cutoff 200 ng/mL Methadone, urine                   Cutoff 300 ng/mL The urine drug screen provides only a preliminary, unconfirmed analytical test result and should not be used for non-medical purposes. Clinical consideration and professional judgment should be applied to any positive drug screen result due to possible interfering substances. A more specific alternate chemical method must be used in order to obtain a confirmed analytical result. Gas chromatography / mass spectrometry (GC/MS) is the preferred confirmat ory method. Performed at [facility], [address] [place], [place] [address]   SARS Coronavirus 2 [facility](Hospital order, Performed in [facility] hospital lab)     Status: None   Collection Time: 11/14/18  4:57 PM  Result Value Ref Range   SARS Coronavirus 2 NEGATIVE NEGATIVE    Comment: (NOTE) If result is NEGATIVE SARS-CoV-2 target nucleic acids are NOT DETECTED. The SARS-CoV-2 RNA is generally detectable in upper and lower  respiratory specimens during the acute phase of infection. The lowest  concentration of SARS-CoV-2 viral copies this  assay can detect is 250  copies /  mL. A negative result does not preclude SARS-CoV-2 infection  and should not be used as the sole basis for treatment or other  patient management decisions.  A negative result may occur with  improper specimen collection / handling, submission of specimen other  than nasopharyngeal swab, presence of viral mutation(s) within the  areas targeted by this assay, and inadequate number of viral copies  (<250 copies / mL). A negative result must be combined with clinical  observations, patient history, and epidemiological information. If result is POSITIVE SARS-CoV-2 target nucleic acids are DETECTED. The SARS-CoV-2 RNA is generally detectable in upper and lower  respiratory specimens dur ing the acute phase of infection.  Positive  results are indicative of active infection with SARS-CoV-2.  Clinical  correlation with patient history and other diagnostic information is  necessary to determine patient infection status.  Positive results do  not rule out bacterial infection or co-infection with other viruses. If result is PRESUMPTIVE POSTIVE SARS-CoV-2 nucleic acids MAY BE PRESENT.   A presumptive positive result was obtained on the submitted specimen  and confirmed on repeat testing.  While 2019 novel coronavirus  (SARS-CoV-2) nucleic acids may be present in the submitted sample  additional confirmatory testing may be necessary for epidemiological  and / or clinical management purposes  to differentiate between  SARS-CoV-2 and other Sarbecovirus currently known to infect humans.  If clinically indicated additional testing with an alternate test  methodology [phone]) is advised. The SARS-CoV-2 RNA is generally  detectable in upper and lower respiratory sp ecimens during the acute  phase of infection. The expected result is Negative. Fact Sheet for Patients:  BoilerBrush.com.cy Fact Sheet for Healthcare Providers: https://pope.com/ This test is  not yet approved or cleared by the [place] FDA and has been authorized for detection and/or diagnosis of SARS-CoV-2 by FDA under an Emergency Use Authorization (EUA).  This EUA will remain in effect (meaning this test can be used) for the duration of the COVID-19 declaration under Section 564(b)(1) of the Act, 21 U.S.C. section 360bbb-3(b)(1), unless the authorization is terminated or revoked sooner. Performed at [facility], [address] [place], [place] [address]   Pregnancy, urine POC     Status: None   Collection Time: 11/14/18  5:55 PM  Result Value Ref Range   Preg Test, Ur NEGATIVE NEGATIVE    Comment:        THE SENSITIVITY OF THIS METHODOLOGY IS >24 mIU/mL     Blood Alcohol level:  Lab Results  Component Value Date   ETH <10 11/14/2018    Metabolic Disorder Labs:  No results found for: HGBA1C, MPG No results found for: PROLACTIN No results found for: CHOL, TRIG, HDL, CHOLHDL, VLDL, LDLCALC  Current Medications: Current Facility-Administered Medications  Medication Dose Route Frequency Provider Last Rate Last Dose  . acetaminophen (TYLENOL) tablet 650 mg  650 mg Oral Q6H PRN Charm Rings, NP      . alum & mag hydroxide-simeth (MAALOX/MYLANTA) 200-200-20 MG/5ML suspension 30 mL  30 mL Oral Q4H PRN Charm Rings, NP      . gabapentin (NEURONTIN) capsule 300 mg  300 mg Oral TID Malvin Johns, MD      . magnesium hydroxide (MILK OF MAGNESIA) suspension 30 mL  30 mL Oral Daily PRN Charm Rings, NP      . prenatal vitamin w/FE, FA (PRENATAL 1 + 1) 27-1 MG tablet 1 tablet  1  tablet Oral Q1200 Malvin JohnsFarah, Trayquan Kolakowski, MD       PTA Medications: Medications Prior to Admission  Medication Sig Dispense Refill Last Dose  . acetaminophen (TYLENOL 8 HOUR) 650 MG CR tablet Take 1 tablet (650 mg total) by mouth every 8 (eight) hours as needed for pain. (Patient not taking: Reported on 11/14/2018) 10 tablet 0   . fexofenadine (ALLEGRA ALLERGY) 180 MG tablet Take 1  tablet (180 mg total) by mouth daily. (Patient not taking: Reported on 11/14/2018) 30 tablet 0   . fluticasone (FLONASE) 50 MCG/ACT nasal spray Place 2 sprays into both nostrils daily. (Patient not taking: Reported on 11/14/2018) 16 g 0   . ondansetron (ZOFRAN) 4 MG tablet Take 1 tablet (4 mg total) by mouth every 8 (eight) hours as needed for nausea or vomiting. (Patient not taking: Reported on 11/14/2018) 10 tablet 0   . sodium chloride (OCEAN) 0.65 % SOLN nasal spray Place 1 spray into both nostrils as needed for congestion. (Patient not taking: Reported on 11/14/2018) 88 mL 0     Musculoskeletal: Strength & Muscle Tone: within normal limits Gait & Station: normal Patient leans: N/A  Psychiatric Specialty Exam: Physical Exam  Nursing note and vitals reviewed. Constitutional: She appears well-developed and well-nourished.    Review of Systems  Constitutional: Negative.   Eyes: Negative.   Gastrointestinal: Negative.   Neurological: Negative.   Endo/Heme/Allergies: Negative.     Blood pressure 95/81, pulse 88, temperature 98.3 F (36.8 C), temperature source Oral, resp. rate 18, height 5\' 7"  (1.702 m), weight 55.8 kg, SpO2 100 %.Body mass index is 19.26 kg/m.  General Appearance: Casual  Eye Contact:  Good  Speech:  Clear and Coherent  Volume:  Normal  Mood:  Euthymic  Affect:  Appropriate and Congruent  Thought Process:  Coherent and Linear  Orientation:  Full (Time, Place, and Person)  Thought Content:  Logical  Suicidal Thoughts:  No  Homicidal Thoughts:  No  Memory:  Immediate;   Fair Recent;   Fair  Judgement:  Good  Insight:  Fair  Psychomotor Activity:  Normal  Concentration:  Concentration: Good  Recall:  Good  Fund of Knowledge:  Good  Language:  Good  Akathisia:  Negative  Handed:  Right  AIMS (if indicated):     Assets:  Leisure Time Physical Health  ADL's:  Intact  Cognition:  WNL  Sleep:  Number of Hours: 6.5    Treatment Plan Summary: Daily contact  with patient to assess and evaluate symptoms and progress in treatment and Medication management  Observation Level/Precautions:  15 minute checks  Laboratory:  UDS  Psychotherapy: Cognitive and reality based  Medications: Begin low-dose gabapentin can avoid antipsychotics at this point in time  Consultations: None necessary  Discharge Concerns: Longer term stability  Estimated LOS: 2-3  Other: Axis I-methamphetamine to psychosis/cannabis abuse/alcohol abuse Axis II deferred Axis III medically stable   Physician Treatment Plan for Primary Diagnosis: Admit for period of drying out basically, monitor for psychosis at low-dose gabapentin continue cognitive and rehab based therapies Long Term Goal(s): Improvement in symptoms so as ready for discharge  Short Term Goals: Ability to disclose and discuss suicidal ideas, Ability to demonstrate self-control will improve, Ability to identify and develop effective coping behaviors will improve and Ability to maintain clinical measurements within normal limits will improve  Physician Treatment Plan for Secondary Diagnosis: Active Problems:   Major depressive disorder, recurrent severe without psychotic features (H[facility])   Methamphetamine-induced psychotic disorder ([facility])  Long Term Goal(s): Improvement in symptoms so as ready for discharge  Short Term Goals: Ability to demonstrate self-control will improve, Ability to identify and develop effective coping behaviors will improve and Ability to maintain clinical measurements within normal limits will improve  I certify that inpatient services furnished can reasonably be expected to improve the patient's condition.    Malvin Johns, MD 8/30/202010:35 AM

## 2018-11-15 NOTE — Progress Notes (Signed)
Patient states that she wants to speak with the doctor before taking medication.

## 2018-11-15 NOTE — [facility] Group Notes (Signed)
LCSW Aftercare Discharge Planning Group Note   11/15/2018 1335  Type of Group and Topic: Psychoeducational Group:  Discharge Planning  Participation Level:  Did Not Attend  Description of Group  Discharge planning group reviews patient's anticipated discharge plans and assists patients to anticipate and address any barriers to wellness/recovery in the community.  Suicide prevention education is reviewed with patients in group.  Therapeutic Goals 1. Patients will state their anticipated discharge plan and mental health aftercare 2. Patients will identify potential barriers to wellness in the community setting 3. Patients will engage in problem solving, solution focused discussion of ways to anticipate and address barriers to wellness/recovery  Summary of Patient Progress   Plan for Discharge/Comments:    Transportation Means:   Supports:  Therapeutic Modalities: Motivational Interviewing    Joanne Chars, [place] 11/15/2018 2:26 PM

## 2018-11-15 NOTE — [facility] Suicide Risk Assessment (Signed)
[place] INPATIENT:  Family/Significant Other Suicide Prevention Education  Suicide Prevention Education:  Patient Refusal for Family/Significant Other Suicide Prevention Education: The patient Martha Mercado has refused to provide written consent for family/significant other to be provided Family/Significant Other Suicide Prevention Education during admission and/or prior to discharge.  Physician notified.  Joanne Chars, LCSW 11/15/2018, 12:59 PM

## 2018-11-15 NOTE — Progress Notes (Signed)
Patient states that she will come and take her medication after she has warmed up.

## 2018-11-15 NOTE — Plan of Care (Signed)
D- Patient alert and oriented. Patient presents in a pleasant mood on assessment stating that she didn't sleep last night because the bed was uncomfortable. Patient denied any signs/symptoms of depression/anxiety to this Probation officer. Patient also denied SI, HI, AVH, and pain at this time. Patient's goal for today is to "talk to the doctor". Patient wasn't too forthcoming, however, she did state that she has to be to work at 10 am in the morning.  A- Scheduled medications administered to patient, per MD orders. Support and encouragement provided.  Routine safety checks conducted every 15 minutes.  Patient informed to notify staff with problems or concerns.  R- No adverse drug reactions noted. Patient contracts for safety at this time. Patient compliant with medications and treatment plan. Patient receptive, calm, and cooperative. Patient interacts well with others on the unit.  Patient remains safe at this time.  Problem: Education: Goal: Knowledge of [facility] Education information/materials will improve Outcome: Progressing Goal: Emotional status will improve Outcome: Progressing Goal: Mental status will improve Outcome: Progressing Goal: Verbalization of understanding the information provided will improve Outcome: Progressing   Problem: Activity: Goal: Interest or engagement in activities will improve Outcome: Progressing Goal: Sleeping patterns will improve Outcome: Progressing   Problem: Coping: Goal: Ability to verbalize frustrations and anger appropriately will improve Outcome: Progressing Goal: Ability to demonstrate self-control will improve Outcome: Progressing   Problem: Health Behavior/Discharge Planning: Goal: Identification of resources available to assist in meeting health care needs will improve Outcome: Progressing Goal: Compliance with treatment plan for underlying cause of condition will improve Outcome: Progressing   Problem: Physical Regulation: Goal:  Ability to maintain clinical measurements within normal limits will improve Outcome: Progressing   Problem: Safety: Goal: Periods of time without injury will increase Outcome: Progressing   Problem: Education: Goal: Utilization of techniques to improve thought processes will improve Outcome: Progressing Goal: Knowledge of the prescribed therapeutic regimen will improve Outcome: Progressing   Problem: Activity: Goal: Interest or engagement in leisure activities will improve Outcome: Progressing Goal: Imbalance in normal sleep/wake cycle will improve Outcome: Progressing   Problem: Coping: Goal: Coping ability will improve Outcome: Progressing Goal: Will verbalize feelings Outcome: Progressing   Problem: Health Behavior/Discharge Planning: Goal: Ability to make decisions will improve Outcome: Progressing Goal: Compliance with therapeutic regimen will improve Outcome: Progressing   Problem: Role Relationship: Goal: Will demonstrate positive changes in social behaviors and relationships Outcome: Progressing   Problem: Safety: Goal: Ability to disclose and discuss suicidal ideas will improve Outcome: Progressing Goal: Ability to identify and utilize support systems that promote safety will improve Outcome: Progressing   Problem: Self-Concept: Goal: Will verbalize positive feelings about self Outcome: Progressing Goal: Level of anxiety will decrease Outcome: Progressing

## 2018-11-16 NOTE — Discharge Summary (Signed)
Physician Discharge Summary Note  Patient:  Martha Mercado is an 23 y.o., female MRN:  [id number] DOB:  12/01/1995 Patient phone:  [phone] (home)  Patient address:   [address] [place] [id number],  Total Time spent with patient: 45 minutes  Date of Admission:  11/14/2018 Date of Discharge: November 16, 2018  Reason for Admission: Patient was admitted through the emergency room because of psychotic symptoms associated with methamphetamine abuse  Principal Problem: Methamphetamine-induced psychotic disorder [facility](HCC) Discharge Diagnoses: Principal Problem:   Methamphetamine-induced psychotic disorder (HCC) Active Problems:   Methamphetamine abuse (HCC)   Past Psychiatric History: Patient has admitted to having had previous similar psychotic symptoms also related to methamphetamine abuse but no previous mental health treatment  Past Medical History:  Past Medical History:  Diagnosis Date  . Dysmenorrhea    History reviewed. No pertinent surgical history. Family History:  Family History  Problem Relation Age of Onset  . Hypertension Maternal Grandmother   . Hypertension Maternal Grandfather   . Diabetes Paternal Grandfather   . Heart disease Paternal Grandfather    Family Psychiatric  History: Reports alcohol abuse in both parents Social History:  Social History   Substance and Sexual Activity  Alcohol Use No  . Alcohol/week: 0.0 standard drinks     Social History   Substance and Sexual Activity  Drug Use Yes  . Types: Cocaine, Marijuana, Methamphetamines    Social History   Socioeconomic History  . Marital status: Single    Spouse name: Not on file  . Number of children: Not on file  . Years of education: Not on file  . Highest education level: Not on file  Occupational History  . Not on file  Social Needs  . Financial resource strain: Not on file  . Food insecurity    Worry: Not on file    Inability: Not on file  . Transportation needs     Medical: Not on file    Non-medical: Not on file  Tobacco Use  . Smoking status: Never Smoker  . Smokeless tobacco: Never Used  Substance and Sexual Activity  . Alcohol use: No    Alcohol/week: 0.0 standard drinks  . Drug use: Yes    Types: Cocaine, Marijuana, Methamphetamines  . Sexual activity: Yes    Birth control/protection: None  Lifestyle  . Physical activity    Days per week: Not on file    Minutes per session: Not on file  . Stress: Not on file  Relationships  . Social Musicianconnections    Talks on phone: Not on file    Gets together: Not on file    Attends religious service: Not on file    Active member of club or organization: Not on file    Attends meetings of clubs or organizations: Not on file    Relationship status: Not on file  Other Topics Concern  . Not on file  Social History Narrative  . Not on file    Hospital Course: Patient admitted to the psychiatric ward.  Did not show any signs of alcohol withdrawal.  No seizures no tremors.  Thoughts became more lucid very quickly.  Did not engage in any dangerous behavior.  Was not suicidal.  Patient acknowledges methamphetamine abuse and alcohol abuse and says that she intends to stop drug abuse.  She is agreeable to referral to outpatient mental health treatment for substance abuse.  At this point there is no indication for any psychiatric medicine or further  hospitalization.  Patient will be discharged with a plan for outpatient substance abuse treatment in the community.  Psychoeducation about the obvious dangers of substance abuse especially stimulants completed.  Physical Findings: AIMS: Facial and Oral Movements Muscles of Facial Expression: None, normal Lips and Perioral Area: None, normal Jaw: None, normal Tongue: None, normal,Extremity Movements Upper (arms, wrists, hands, fingers): None, normal Lower (legs, knees, ankles, toes): None, normal, Trunk Movements Neck, shoulders, hips: None, normal, Overall  Severity Severity of abnormal movements (highest score from questions above): None, normal Incapacitation due to abnormal movements: None, normal Patient's awareness of abnormal movements (rate only patient's report): No Awareness, Dental Status Current problems with teeth and/or dentures?: No Does patient usually wear dentures?: No  CIWA:    COWS:     Musculoskeletal: Strength & Muscle Tone: within normal limits Gait & Station: normal Patient leans: N/A  Psychiatric Specialty Exam: Physical Exam  Nursing note and vitals reviewed. Constitutional: She appears well-developed and well-nourished.  HENT:  Head: Normocephalic and atraumatic.  Eyes: Pupils are equal, round, and reactive to light. Conjunctivae are normal.  Neck: Normal range of motion.  Cardiovascular: Regular rhythm and normal heart sounds.  Respiratory: Effort normal.  GI: Soft.  Musculoskeletal: Normal range of motion.  Neurological: She is alert.  Skin: Skin is warm and dry.  Psychiatric: She has a normal mood and affect. Her behavior is normal. Judgment and thought content normal.    Review of Systems  Constitutional: Negative.   HENT: Negative.   Eyes: Negative.   Respiratory: Negative.   Cardiovascular: Negative.   Gastrointestinal: Negative.   Musculoskeletal: Negative.   Skin: Negative.   Neurological: Negative.   Psychiatric/Behavioral: Negative.     Blood pressure 113/75, pulse 70, temperature 97.7 F (36.5 C), temperature source Oral, resp. rate 18, height 5\' 7"  (1.702 m), weight 55.8 kg, SpO2 100 %.Body mass index is 19.26 kg/m.  General Appearance: Casual  Eye Contact:  Good  Speech:  Clear and Coherent  Volume:  Normal  Mood:  Euthymic  Affect:  Constricted  Thought Process:  Goal Directed  Orientation:  Full (Time, Place, and Person)  Thought Content:  Logical  Suicidal Thoughts:  No  Homicidal Thoughts:  No  Memory:  Immediate;   Fair Recent;   Fair Remote;   Fair  Judgement:  Fair   Insight:  Fair  Psychomotor Activity:  Normal  Concentration:  Concentration: Fair  Recall:  [employer] of Knowledge:  Fair  Language:  Fair  Akathisia:  No  Handed:  Right  AIMS (if indicated):     Assets:  Desire for Improvement Housing Physical Health  ADL's:  Intact  Cognition:  WNL  Sleep:  Number of Hours: 7.75     Have you used any form of tobacco in the last 30 days? (Cigarettes, Smokeless Tobacco, Cigars, and/or Pipes): Yes  Has this patient used any form of tobacco in the last 30 days? (Cigarettes, Smokeless Tobacco, Cigars, and/or Pipes) Yes, Yes, A prescription for an FDA-approved tobacco cessation medication was offered at discharge and the patient refused  Blood Alcohol level:  Lab Results  Component Value Date   ETH <10 11/14/2018    Metabolic Disorder Labs:  No results found for: HGBA1C, MPG No results found for: PROLACTIN No results found for: CHOL, TRIG, HDL, CHOLHDL, VLDL, LDLCALC  See Psychiatric Specialty Exam and Suicide Risk Assessment completed by Attending Physician prior to discharge.  Discharge destination:  Home  Is patient on multiple antipsychotic  therapies at discharge:  No   Has Patient had three or more failed trials of antipsychotic monotherapy by history:  No  Recommended Plan for Multiple Antipsychotic Therapies: NA  Discharge Instructions    Diet - low sodium heart healthy   Complete by: As directed    Increase activity slowly   Complete by: As directed      Allergies as of 11/16/2018   No Known Allergies     Medication List    STOP taking these medications   acetaminophen 650 MG CR tablet Commonly known as: Tylenol 8 Hour   fexofenadine 180 MG tablet Commonly known as: Allegra Allergy   fluticasone 50 MCG/ACT nasal spray Commonly known as: FLONASE   ondansetron 4 MG tablet Commonly known as: Zofran   sodium chloride 0.65 % Soln nasal spray Commonly known as: OCEAN      Follow-up [place] Follow up.   Why: Please meet Sherrian Divers on Tuesday 11/17/18 at 8:00 AM at [facility] for peer support services. Thank You! Contact information: [place] [address] [phone]           Follow-up recommendations:  Activity:  Activity as tolerated Diet:  Regular diet Other:  Follow-up with outpatient referral for substance abuse treatment  Comments: No indication for any psychiatric medication at discharge  Signed: Alethia Berthold, MD 11/16/2018, 1:12 PM

## 2018-11-16 NOTE — Plan of Care (Signed)
  Problem: Group Participation Goal: STG - Patient will engage in groups without prompting or encouragement from LRT x3 group sessions within 5 recreation therapy group sessions Description: STG - Patient will engage in groups without prompting or encouragement from LRT x3 group sessions within 5 recreation therapy group sessions Outcome: Not Applicable   

## 2018-11-16 NOTE — Progress Notes (Signed)
Recreation Therapy Notes  INPATIENT RECREATION THERAPY ASSESSMENT  Patient Details Name: Martha Mercado MRN: [id number] DOB: March 01, 1996 Today's Date: 11/16/2018       Information Obtained From: Patient  Able to Participate in Assessment/Interview: Yes  Patient Presentation: Responsive  Reason for Admission (Per Patient): Active Symptoms, Substance Abuse  Patient Stressors:    Coping Skills:   Music, Exercise  Leisure Interests (2+):  Exercise - Running, Music - Singing, Music - Listen, Sports - Dance  Frequency of Recreation/Participation: Monthly  Awareness of Community Resources:     [employer]:     Current Use:    If no, Barriers?:    Expressed Interest in [employer] Information:    [employer] of Residence:  Insurance underwriter  Patient Main Form of Transportation: Musician  Patient Strengths:  Nice  Patient Identified Areas of Improvement:  No more drugs  Patient Goal for Hospitalization:  To leave and to go to work  Current SI (including self-harm):  No  Current HI:  No  Current AVH: No  Staff Intervention Plan: Group Attendance, Collaborate with Interdisciplinary Treatment Team  Consent to Intern Participation: N/A  Tajay Muzzy 11/16/2018, 12:55 PM

## 2018-11-16 NOTE — Progress Notes (Signed)
Patient is asleep at the time of medications, so this writer will administer medications when she wakes up.

## 2018-11-16 NOTE — Progress Notes (Signed)
Patient ID: Martha Mercado, female   DOB: 05-Nov-1995, 23 y.o.   MRN: [id number]  Discharge Note:  Patient denies SI/HI/AVH at this time. Discharge instructions, AVS, and transition record gone over with patient. Patient agrees to comply with medication management, follow-up visit, and outpatient therapy. Patient belongings were kept in her room. Patient questions and concerns addressed and answered. Patient ambulatory off unit. Patient discharged to home via [place] taxicab services.

## 2018-11-16 NOTE — Progress Notes (Signed)
Recreation Therapy Notes  INPATIENT RECREATION TR PLAN  Patient Details Name: Martha Mercado MRN: [id number] DOB: [birth year] Today's Date: 11/16/2018  Rec Therapy Plan Is patient appropriate for Therapeutic Recreation?: Yes Treatment times per week: At least 3 Estimated Length of Stay: 5-7 Days TR Treatment/Interventions: Group participation (Comment)  Discharge Criteria Pt will be discharged from therapy if:: Discharged Treatment plan/goals/alternatives discussed and agreed upon by:: Patient/family  Discharge Summary Short term goals set: Patient will engage in groups without prompting or encouragement from LRT x3 group sessions within 5 recreation therapy group sessions Short term goals met: Not met Reason goals not met: Patient did not attend any groups Therapeutic equipment acquired: N/A Reason patient discharged from therapy: Discharge from hospital Pt/family agrees with progress & goals achieved: Yes Date patient discharged from therapy: 11/23/18   Idabell Picking 11/16/2018, 1:44 PM

## 2018-11-16 NOTE — BHH Group Notes (Signed)

## 2018-11-16 NOTE — Progress Notes (Signed)
  [facility] Adult Case Management Discharge Plan :  Will you be returning to the same living situation after discharge:  Yes,  home At discharge, do you have transportation home?: Yes,  will call family Do you have the ability to pay for your medications: Yes,  mental health  Release of information consent forms completed and in the chart;   Patient to Follow up at: Follow-up Information    [place] Follow up.   Why: Please meet Sherrian Divers on Tuesday 11/17/18 at 8:00 AM at [facility] for peer support services. Thank You! Contact information: [place] [address] [phone]           Next level of care provider has access to [place] and Suicide Prevention discussed: Yes,  SPE completed with pt as pt declined collateral contact  Have you used any form of tobacco in the last 30 days? (Cigarettes, Smokeless Tobacco, Cigars, and/or Pipes): Yes  Has patient been referred to the Quitline?: Patient refused referral  Patient has been referred for addiction treatment: [place], LCSW 11/16/2018, 1:05 PM

## 2018-11-16 NOTE — Progress Notes (Signed)
Patient is calm and alert responding appropriately to treatment regimen, has no complain , maintaining safety in the unit, encouraged patient to develop self coping mechanism to manage her emotional depressions , patient voice no physical  concerns , isolative most of the shift , support and education is provided , patient only requiring 15 minutes safety checks. Denies SI/HI/AVH no distress.

## 2018-11-16 NOTE — Progress Notes (Signed)
Recreation Therapy Notes   Date: 11/16/2018  Time: 9:30 am   Location: Craft room   Behavioral response: N/A   Intervention Topic: Relaxation      Discussion/Intervention: Patient did not attend group.   Clinical Observations/Feedback:  Patient did not attend group.   Clarance Bollard LRT/CTRS        Martha Mercado 11/16/2018 11:21 AM 

## 2018-11-16 NOTE — BHH Suicide Risk Assessment (Signed)
[facility] Discharge Suicide Risk Assessment   Principal Problem: Methamphetamine-induced psychotic disorder [facility]) Discharge Diagnoses: Principal Problem:   Methamphetamine-induced psychotic disorder ([place]) Active Problems:   Methamphetamine abuse ([place])   Total Time spent with patient: 45 minutes  Musculoskeletal: Strength & Muscle Tone: within normal limits Gait & Station: normal Patient leans: N/A  Psychiatric Specialty Exam: Review of Systems  Constitutional: Negative.   HENT: Negative.   Eyes: Negative.   Respiratory: Negative.   Cardiovascular: Negative.   Gastrointestinal: Negative.   Musculoskeletal: Negative.   Skin: Negative.   Neurological: Negative.   Psychiatric/Behavioral: Positive for substance abuse. Negative for depression, hallucinations, memory loss and suicidal ideas. The patient is nervous/anxious. The patient does not have insomnia.     Blood pressure 113/75, pulse 70, temperature 97.7 F (36.5 C), temperature source Oral, resp. rate 18, height 5\' 7"  (1.702 m), weight 55.8 kg, SpO2 100 %.Body mass index is 19.26 kg/m.  General Appearance: Casual  Eye Contact::  Fair  Speech:  Clear and [id number]  Volume:  Normal  Mood:  Euthymic  Affect:  Congruent  Thought Process:  Coherent  Orientation:  Full (Time, Place, and Person)  Thought Content:  Logical  Suicidal Thoughts:  No  Homicidal Thoughts:  No  Memory:  Immediate;   Fair Recent;   Fair Remote;   Fair  Judgement:  Fair  Insight:  Fair  Psychomotor Activity:  Decreased  Concentration:  Fair  Recall:  [employer] of Knowledge:Fair  Language: Fair  Akathisia:  No  Handed:  Right  AIMS (if indicated):     Assets:  Communication Skills Desire for Improvement Housing Physical Health  Sleep:  Number of Hours: 7.75  Cognition: WNL  ADL's:  Intact   Mental Status Per Nursing Assessment::   On Admission:  Suicidal ideation indicated by others, Suicide plan  Demographic Factors:  Adolescent or  young adult  Loss Factors: NA  Historical Factors: Impulsivity  Risk Reduction Factors:   Living with another person, especially a relative and Positive social support  Continued Clinical Symptoms:  Alcohol/Substance Abuse/Dependencies  Cognitive Features That Contribute To Risk:  None    Suicide Risk:  Minimal: No identifiable suicidal ideation.  Patients presenting with no risk factors but with morbid ruminations; may be classified as minimal risk based on the severity of the depressive symptoms  Follow-up Information    [place] Follow up.   Why: Please meet Sherrian Divers on Tuesday 11/17/18 at 8:00 AM at [facility] for peer support services. Thank You! Contact information: [place] [address] [phone]           Plan Of Care/Follow-up recommendations:  Activity:  Activity as tolerated Diet:  Regular diet Other:  Follow-up with outpatient care as recommended for substance abuse treatment  Alethia Berthold, MD 11/16/2018, 1:09 PM

## 2019-04-08 IMAGING — CT CT ABD-PELV W/ CM
2 of 4 series · 15 of 46 positions shown, 17 images · IV contrast (APPLIED)
Comparison: Pelvic radiograph 12/01/2013

CLINICAL DATA: 21-year-old female with suprapubic pain and nausea.

EXAM:
CT ABDOMEN AND PELVIS WITH CONTRAST
TECHNIQUE: Multidetector CT imaging of the abdomen and pelvis was performed
using the standard protocol following bolus administration of
intravenous contrast.
CONTRAST:  100mL MIFEIL-7FF IOPAMIDOL (MIFEIL-7FF) INJECTION 61%

[Series 2: routine abd/pel with · axial · 0.59mm/px · z∈[-982,-572]mm · 12 of 90 slices shown, 14 images]
[im 4/90  soft-tissue]
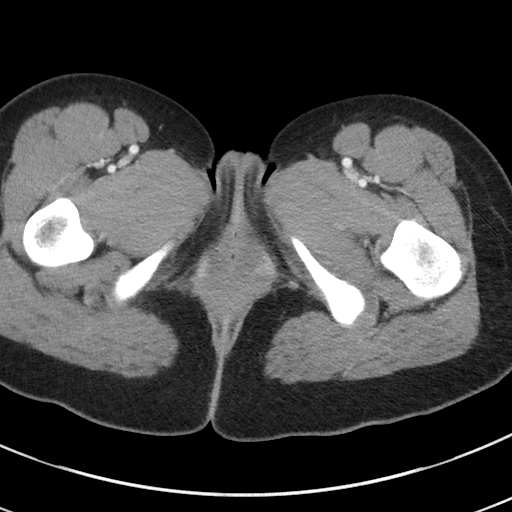
[im 4/90  bone]
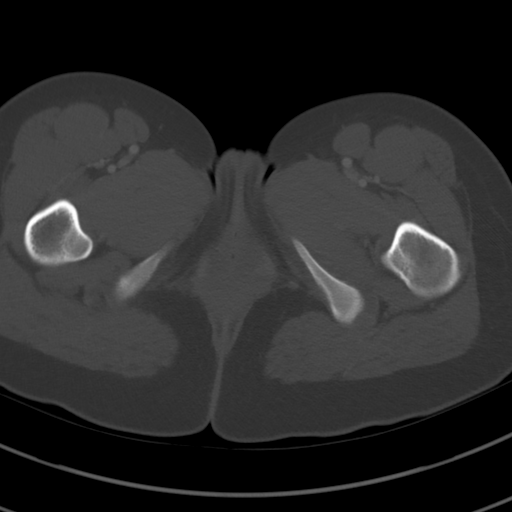
[im 12/90  soft-tissue]
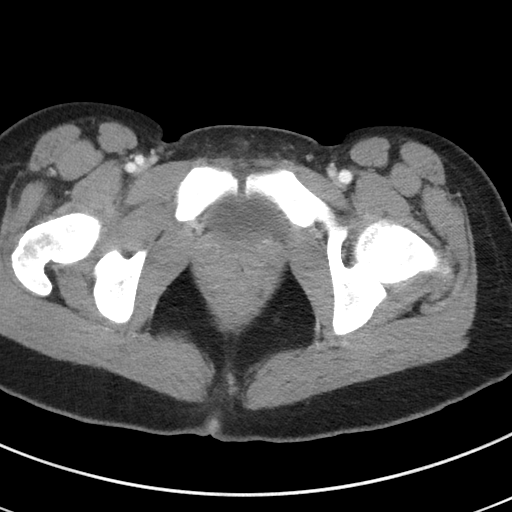
[im 19/90  soft-tissue]
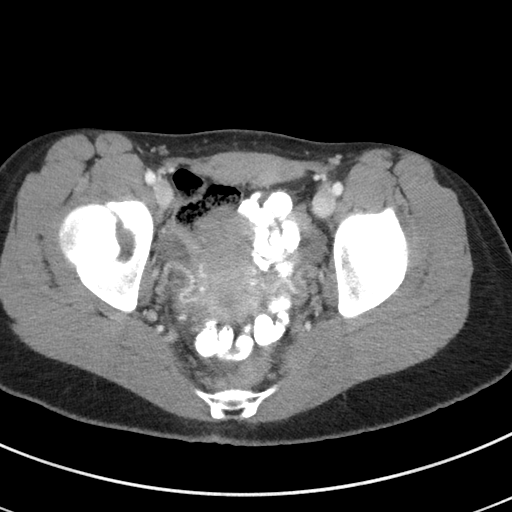
[im 26/90  soft-tissue]
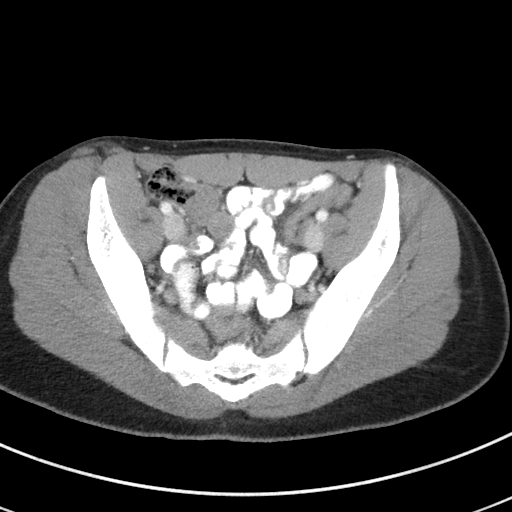
[im 34/90  soft-tissue]
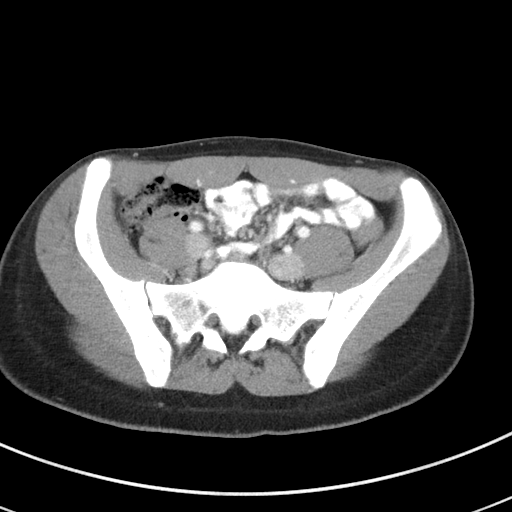
[im 41/90  soft-tissue]
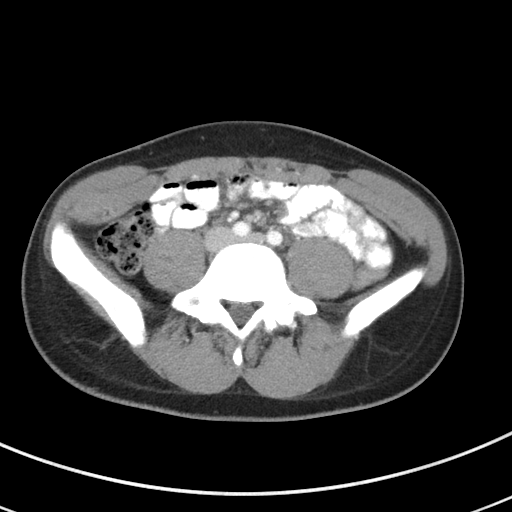
[im 49/90  soft-tissue]
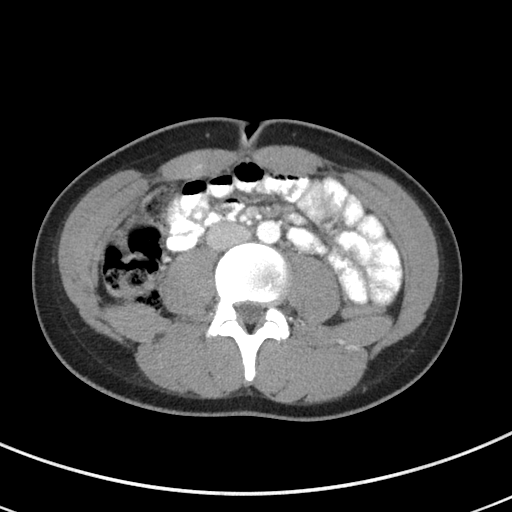
[im 56/90  soft-tissue]
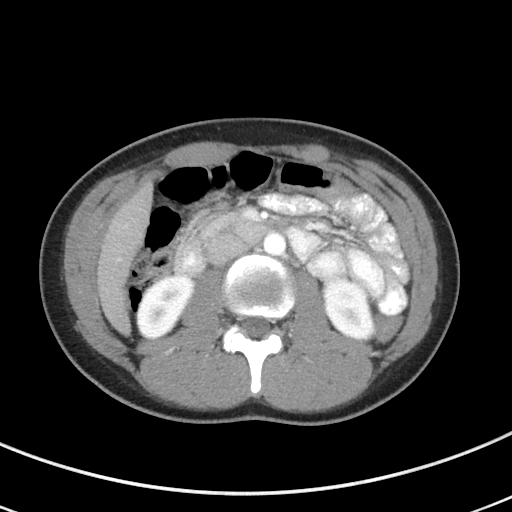
[im 64/90  soft-tissue]
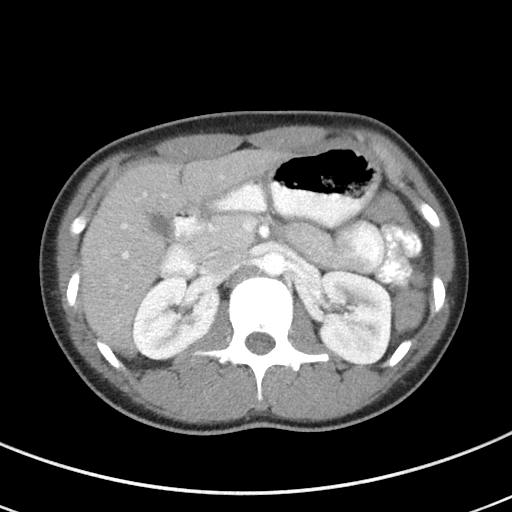
[im 64/90  bone]
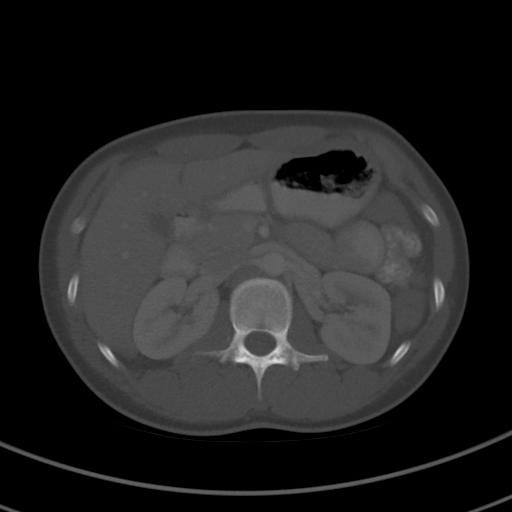
[im 71/90  soft-tissue]
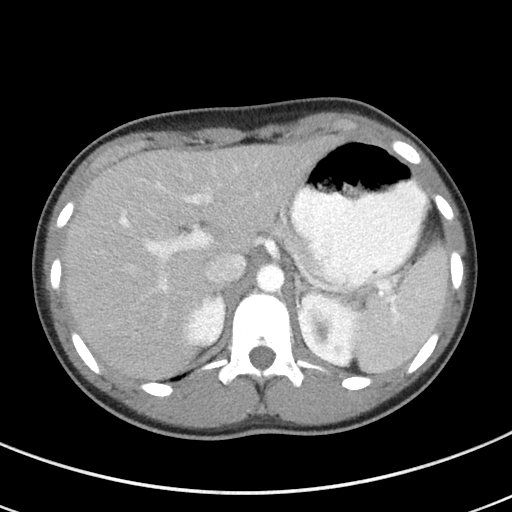
[im 78/90  soft-tissue]
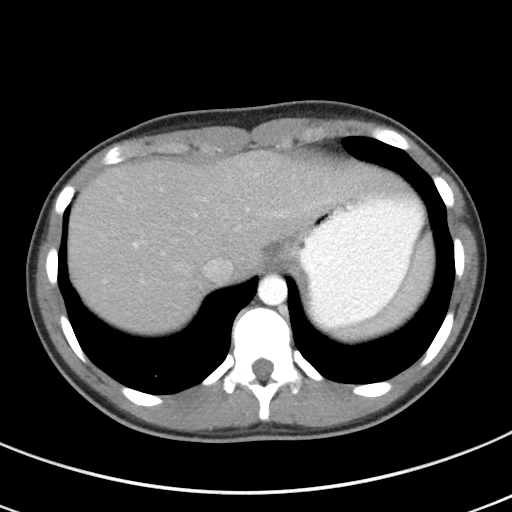
[im 86/90  soft-tissue]
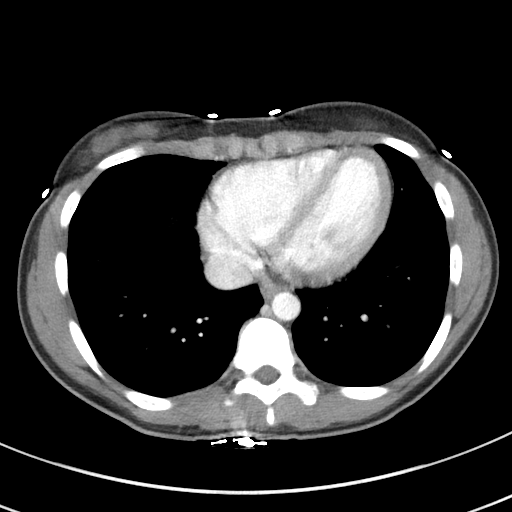

[Series 5: coronal st · coronal · 0.61mm/px · 3 of 68 slices shown]
[im 23/68  soft-tissue]
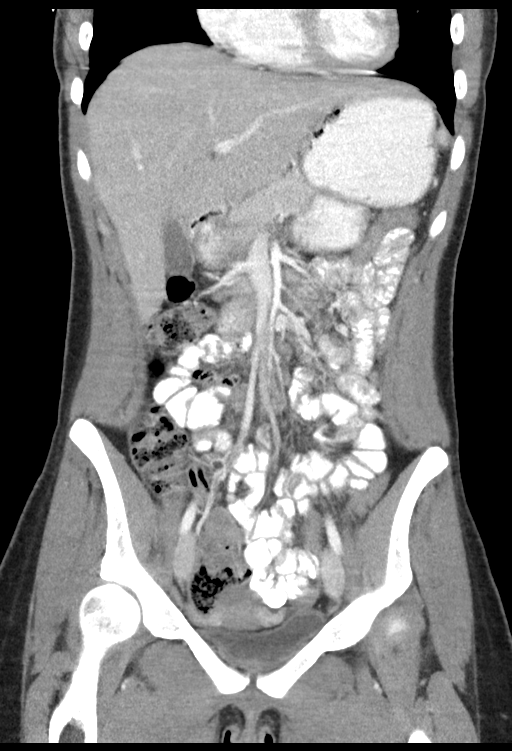
[im 30/68  soft-tissue]
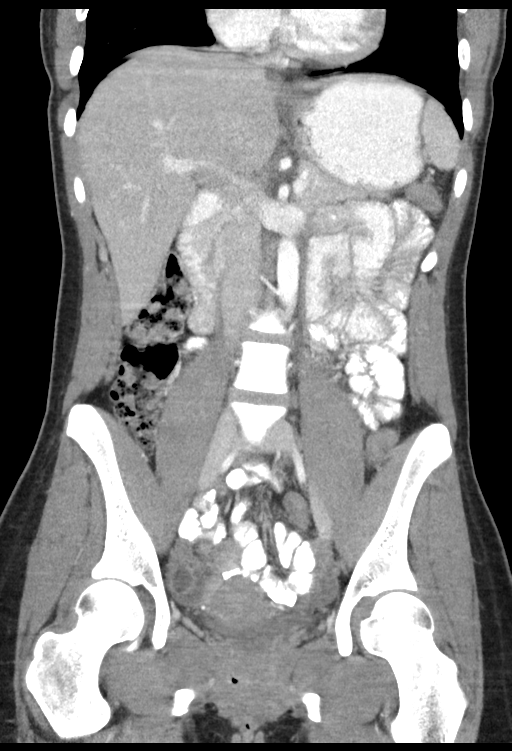
[im 38/68  soft-tissue]
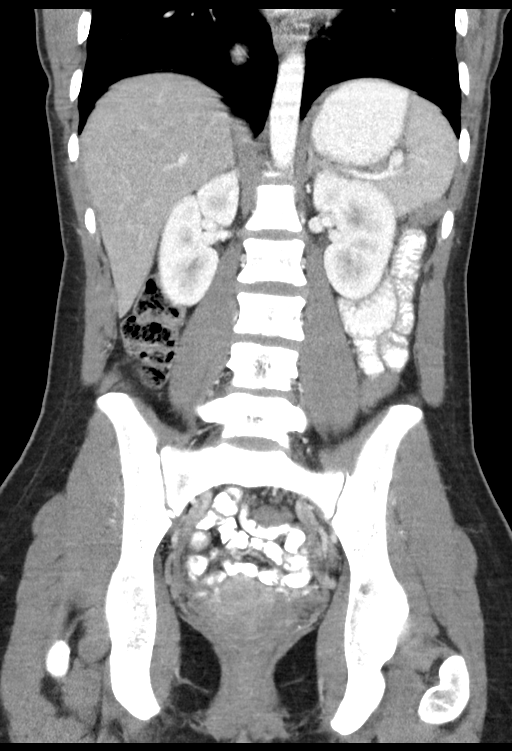

[15 of 46 positions shown; findings below may reference images not displayed]

FINDINGS: Lower chest: No acute abnormality.

Hepatobiliary: No focal liver abnormality is seen. No gallstones,
gallbladder wall thickening, or biliary dilatation.

Pancreas: Unremarkable. No pancreatic ductal dilatation or
surrounding inflammatory changes.

Spleen: Normal in size without focal abnormality.

Adrenals/Urinary Tract: Adrenal glands are unremarkable. Kidneys are
normal, without renal calculi, focal lesion, or hydronephrosis.
Bladder is unremarkable.

Stomach/Bowel: Oral contrast opacifies the stomach and multiple
loops of small bowel. There is no evidence of bowel obstruction or
active inflammation. A tubular structure in the right hemipelvis
medial to the cecum likely represents the nonopacified terminal
ileum. Thickened appearance of the terminal ileum is likely related
to underdistention. No definite inflammatory changes noted in the
surrounding infected. Normal appendix.

Vascular/Lymphatic: No significant vascular findings are present. No
enlarged abdominal or pelvic lymph nodes.

Reproductive: The uterus is anteverted and appears unremarkable.
There is a 1.7 cm right ovarian dominant follicle/cyst. The left
ovary is unremarkable with

Other: There is no intra-abdominal free air. Small free fluid within
the pelvis.

Musculoskeletal: No acute or significant osseous findings.
IMPRESSION: Unremarkable CT of the abdomen pelvis. No acute intra-abdominal or
pelvic pathology identified.

## 2019-07-12 ENCOUNTER — Ambulatory Visit: Payer: Self-pay | Admitting: Advanced Practice Midwife

## 2019-07-12 ENCOUNTER — Encounter: Payer: Self-pay | Admitting: Advanced Practice Midwife

## 2019-07-12 ENCOUNTER — Other Ambulatory Visit: Payer: Self-pay

## 2019-07-12 DIAGNOSIS — Z0389 Encounter for observation for other suspected diseases and conditions ruled out: Secondary | ICD-10-CM | POA: Diagnosis not present

## 2019-07-12 DIAGNOSIS — F1290 Cannabis use, unspecified, uncomplicated: Secondary | ICD-10-CM | POA: Insufficient documentation

## 2019-07-12 DIAGNOSIS — Z113 Encounter for screening for infections with a predominantly sexual mode of transmission: Secondary | ICD-10-CM

## 2019-07-12 DIAGNOSIS — Z789 Other specified health status: Secondary | ICD-10-CM

## 2019-07-12 DIAGNOSIS — Z3009 Encounter for other general counseling and advice on contraception: Secondary | ICD-10-CM | POA: Diagnosis not present

## 2019-07-12 DIAGNOSIS — A599 Trichomoniasis, unspecified: Secondary | ICD-10-CM

## 2019-07-12 DIAGNOSIS — Z1388 Encounter for screening for disorder due to exposure to contaminants: Secondary | ICD-10-CM | POA: Diagnosis not present

## 2019-07-12 DIAGNOSIS — Z7289 Other problems related to lifestyle: Secondary | ICD-10-CM

## 2019-07-12 DIAGNOSIS — F1090 Alcohol use, unspecified, uncomplicated: Secondary | ICD-10-CM

## 2019-07-12 LAB — WET PREP FOR TRICH, YEAST, CLUE
Trichomonas Exam: POSITIVE — AB
Yeast Exam: NEGATIVE

## 2019-07-12 MED ORDER — METRONIDAZOLE 500 MG PO TABS: 2000.0000 mg | ORAL_TABLET | Freq: Once | ORAL | 0 refills | Status: AC

## 2019-07-12 NOTE — Progress Notes (Signed)
Wet mount reviewed, patient treated for Trich per SO..[place] Maxim Brewer-Jensen, RN  ?

## 2019-07-12 NOTE — Progress Notes (Signed)
[facility] Department STI clinic/screening visit  Subjective:  Martha Mercado is a 24 y.o. SBF nullip exsmoker (last use 05/2019) female being seen today for an STI screening visit. The patient reports they do not have symptoms.  Patient reports that they do not desire a pregnancy in the next year.   They reported they are not interested in discussing contraception today.  Patient's last menstrual period was 06/17/2019 (exact date).   Patient has the following medical conditions:   Patient Active Problem List   Diagnosis Date Noted  . Methamphetamine-induced psychotic disorder ([place])   . Methamphetamine abuse ([place]) 11/14/2018  . Blood in stool 08/15/2016  . Ovarian cyst, right 08/15/2016  . Chlamydia infection 12/29/2014    Chief Complaint  Patient presents with  . SEXUALLY TRANSMITTED DISEASE    HPI  Patient reports no sxs.  LMP 06/17/19.  Last sex 06/21/19 with condom.  Last MJ 05/2019.  Hx meth.  Last ETOH 07/09/19 (5 shots Tequila)  See flowsheet for further details and programmatic requirements.    The following portions of the patient's history were reviewed and updated as appropriate: allergies, current medications, past medical history, past social history, past surgical history and problem list.  Objective:  There were no vitals filed for this visit.  Physical Exam Vitals and nursing note reviewed.  Constitutional:      Appearance: Normal appearance.  HENT:     Head: Normocephalic and atraumatic.     Mouth/Throat:     Mouth: Mucous membranes are moist.     Pharynx: Oropharynx is clear. No oropharyngeal exudate or posterior oropharyngeal erythema.  Eyes:     Conjunctiva/sclera: Conjunctivae normal.  Pulmonary:     Effort: Pulmonary effort is normal.  Abdominal:     General: Abdomen is flat.     Palpations: Abdomen is soft. There is no mass.     Tenderness: There is no abdominal tenderness. There is no rebound.  Genitourinary:    General: Normal  vulva.     Exam position: Lithotomy position.     Pubic Area: No rash or pubic lice.      Labia:        Right: No rash or lesion.        Left: No rash or lesion.      Vagina: Vaginal discharge (creamy white leukorrhea, ph<4.5) present. No erythema, bleeding or lesions.     Cervix: Normal.     Uterus: Normal.      Adnexa: Right adnexa normal and left adnexa normal.     Rectum: Normal.  Musculoskeletal:     Cervical back: Normal range of motion.  Lymphadenopathy:     Head:     Right side of head: No preauricular or posterior auricular adenopathy.     Left side of head: No preauricular or posterior auricular adenopathy.     Cervical: No cervical adenopathy.     Upper Body:     Right upper body: No supraclavicular or axillary adenopathy.     Left upper body: No supraclavicular or axillary adenopathy.     Lower Body: No right inguinal adenopathy. No left inguinal adenopathy.  Skin:    General: Skin is warm and dry.     Findings: No rash.  Neurological:     Mental Status: She is alert and oriented to person, place, and time.      Assessment and Plan:  KADIJA Mercado is a 24 y.o. female presenting to the [facility] Department  for STI screening  1. Screening examination for venereal disease Treat wet mount per standing orders Immunization nurse consult - WET PREP FOR TRICH, YEAST, CLUE - Syphilis Serology, [place] [facility] - HIV [place] [facility] - Chlamydia/Gonorrhea [place] [facility]     No follow-ups on file.  No future appointments.  Alberteen Spindle, CNM

## 2019-07-12 NOTE — Progress Notes (Signed)
Here today for STD screening. Accepts bloodwork. Imad Shostak, RN ° °

## 2019-07-19 ENCOUNTER — Telehealth: Payer: Self-pay | Admitting: General Practice

## 2019-07-19 NOTE — Telephone Encounter (Signed)
called regarding test results

## 2019-07-20 NOTE — Telephone Encounter (Signed)
This RN attempted to call pt back at phone # provided. Call went to a message stating that voicemail box is not set up yet, so unable to leave voice message.Lyman Speller, RN

## 2019-07-20 NOTE — Telephone Encounter (Signed)
Pt returned call to [facility]. Verified I was speaking with pt with pt's name, DOB and password. Pt states that she would like to know her test results from 07/12/2019. Counseled pt regarding her TR's (HIV, syphillis, GC and Chlamydia) and offered for pt to come in for TR appt to get copies of her TR's, but pt declines at this time. Pt with no other questions or concerns at this time.Lyman Speller, RN

## 2020-02-16 DIAGNOSIS — Z3009 Encounter for other general counseling and advice on contraception: Secondary | ICD-10-CM | POA: Diagnosis not present

## 2020-02-16 DIAGNOSIS — Z113 Encounter for screening for infections with a predominantly sexual mode of transmission: Secondary | ICD-10-CM | POA: Diagnosis not present

## 2020-04-05 ENCOUNTER — Encounter: Payer: Self-pay | Admitting: Family Medicine

## 2020-04-05 ENCOUNTER — Other Ambulatory Visit: Payer: Self-pay

## 2020-04-05 ENCOUNTER — Ambulatory Visit: Payer: Medicaid Other | Admitting: Family Medicine

## 2020-04-05 DIAGNOSIS — B3731 Acute candidiasis of vulva and vagina: Secondary | ICD-10-CM

## 2020-04-05 DIAGNOSIS — Z113 Encounter for screening for infections with a predominantly sexual mode of transmission: Secondary | ICD-10-CM

## 2020-04-05 DIAGNOSIS — Z8659 Personal history of other mental and behavioral disorders: Secondary | ICD-10-CM

## 2020-04-05 DIAGNOSIS — B373 Candidiasis of vulva and vagina: Secondary | ICD-10-CM | POA: Diagnosis not present

## 2020-04-05 LAB — WET PREP FOR TRICH, YEAST, CLUE
Trichomonas Exam: NEGATIVE
Yeast Exam: NEGATIVE

## 2020-04-05 MED ORDER — CLOTRIMAZOLE 1 % VA CREA: 1.0000 | TOPICAL_CREAM | Freq: Every day | VAGINAL | 0 refills | Status: AC

## 2020-04-05 NOTE — Progress Notes (Signed)
[facility] Department STI clinic/screening visit  Subjective:  Martha Mercado is a 25 y.o. female being seen today for an STI screening visit. The patient reports they do have symptoms.  Patient reports that they do not desire a pregnancy in the next year.   They reported they are not interested in discussing contraception today.  Patient's last menstrual period was 03/22/2020.   Patient has the following medical conditions:   Patient Active Problem List   Diagnosis Date Noted  . Marijuana use 07/12/2019  . Alcohol use q weekend 07/12/2019  . Trichomonas infection 07/12/2019  . Methamphetamine-induced psychotic disorder (HCC)   . 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 Blood in stool 08/15/2016  . Ovarian cyst, right 08/15/2016  . Chlamydia infection 12/29/2014    Chief Complaint  Patient presents with  . SEXUALLY TRANSMITTED DISEASE    HPI  Patient reports having discharge   Last HIV test per review of record was 07/12/2019 No history of pap d/t age.  Recommended physical and pap.    See flowsheet for further details and programmatic requirements.    The following portions of the patient's history were reviewed and updated as appropriate: allergies, current medications, past medical history, past social history, past surgical history and problem list.  Objective:  There were no vitals filed for this visit.  Physical Exam Vitals and nursing note reviewed.  Constitutional:      Appearance: Normal appearance.  HENT:     Head: Normocephalic and atraumatic.     Mouth/Throat:     Mouth: Mucous membranes are moist.     Pharynx: Oropharynx is clear. No oropharyngeal exudate or posterior oropharyngeal erythema.  Pulmonary:     Effort: Pulmonary effort is normal.  Chest:  Breasts:     Right: No axillary adenopathy or supraclavicular adenopathy.     Left: No axillary adenopathy or supraclavicular adenopathy.    Abdominal:     General: Abdomen is flat.      Palpations: There is no mass.     Tenderness: There is no abdominal tenderness. There is no rebound.  Genitourinary:    General: Normal vulva.     Exam position: Lithotomy position.     Pubic Area: No rash or pubic lice.      Labia:        Right: No rash or lesion.        Left: No rash or lesion.      Vagina: Normal. No vaginal discharge, erythema, bleeding or lesions.     Cervix: No cervical motion tenderness, discharge, friability, lesion or erythema.     Uterus: Normal.      Adnexa: Right adnexa normal and left adnexa normal.     Rectum: Normal.     Comments: External genitalia without, lice, nits, erythema, edema , lesions or inguinal adenopathy. Vagina with normal mucosa and  Thick white discharge and pH equals 4.  Cervix without visual lesions, uterus firm, mobile, non-tender, no masses, CMT adnexal fullness or tenderness.   Lymphadenopathy:     Head:     Right side of head: No preauricular or posterior auricular adenopathy.     Left side of head: No preauricular or posterior auricular adenopathy.     Cervical: No cervical adenopathy.     Upper Body:     Right upper body: No supraclavicular or axillary adenopathy.     Left upper body: No supraclavicular or axillary adenopathy.     Lower Body: No right inguinal adenopathy.  No left inguinal adenopathy.  Skin:    General: Skin is warm and dry.     Findings: No rash.  Neurological:     Mental Status: She is alert and oriented to person, place, and time.      Assessment and Plan:  [name] is a 25 y.o. female presenting to the [facility] Department for STI screening  1. Screening examination for venereal disease  - Chlamydia/Gonorrhea [place] [facility] - HBV Antigen/Antibody [facility] - HIV/HCV [place] [facility] - Syphilis Serology, [place] [facility] - WET PREP FOR TRICH, YEAST, CLUE  Patient accepted all screenings including vaginal CT/GC and bloodwork for HIV/RPR/HCV/HBV Patient meets criteria for HepB  screening? Yes. Ordered? Yes Patient meets criteria for HepC screening? Yes. Ordered? Yes  Wet prep results negative  Treatment needed for yeast  Discussed time line for State Lab results and that patient will be called with positive results and encouraged patient to call if she had not heard in 2 weeks.  Counseled to return or seek care for continued or worsening symptoms Recommended condom use with all sex  Patient is currently using condoms  to prevent pregnancy.    2. History of anxiety Patient reports having some anxiety, discussed referral to [facility], patient agreed to meet with therapist   - Ambulatory referral to Behavioral Health  3. Yeast vaginitis  yeast visualized during pelvic exam.  Patient treated for yeast.   - clotrimazole (V-R CLOTRIMAZOLE VAGINAL) 1 % vaginal cream; Place 1 Applicatorful vaginally at bedtime for 7 days.  Dispense: 45 g; Refill: 0      Return if symptoms worsen or fail to improve.  No future appointments.  Wendi Snipes, FNP

## 2020-04-05 NOTE — Progress Notes (Signed)
Post:  RN reviewed wet mount with patient. RN dispensed yeast cream per provider orders. Provider orders complete.  Harvie Heck, RN

## 2020-04-13 ENCOUNTER — Encounter: Payer: Self-pay | Admitting: Student

## 2020-04-13 LAB — HM HIV SCREENING LAB: HM HIV Screening: NEGATIVE

## 2020-04-13 LAB — HM HEPATITIS C SCREENING LAB: HM Hepatitis Screen: NEGATIVE

## 2020-04-13 LAB — HEPATITIS B SURFACE ANTIGEN: Hepatitis B Surface Ag: NEGATIVE

## 2020-05-10 ENCOUNTER — Ambulatory Visit: Payer: Medicaid Other | Admitting: Licensed Clinical Social Worker
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
## 2020-05-15 ENCOUNTER — Other Ambulatory Visit: Payer: Self-pay

## 2020-05-15 ENCOUNTER — Emergency Department
Admission: EM | Admit: 2020-05-15 | Discharge: 2020-05-16 | Disposition: A | Discharge status: Home / Self Care | Payer: Medicaid Other | Attending: Emergency Medicine | Admitting: Emergency Medicine

## 2020-05-15 DIAGNOSIS — R45851 Suicidal ideations: Secondary | ICD-10-CM | POA: Insufficient documentation

## 2020-05-15 DIAGNOSIS — Z7289 Other problems related to lifestyle: Secondary | ICD-10-CM

## 2020-05-15 DIAGNOSIS — F1510 Other stimulant abuse, uncomplicated: Secondary | ICD-10-CM | POA: Diagnosis present

## 2020-05-15 DIAGNOSIS — F15959 Other stimulant use, unspecified with stimulant-induced psychotic disorder, unspecified: Secondary | ICD-10-CM | POA: Diagnosis present

## 2020-05-15 DIAGNOSIS — Z20822 Contact with and (suspected) exposure to covid-19: Secondary | ICD-10-CM | POA: Insufficient documentation

## 2020-05-15 DIAGNOSIS — F4325 Adjustment disorder with mixed disturbance of emotions and conduct: Secondary | ICD-10-CM

## 2020-05-15 DIAGNOSIS — F17200 Nicotine dependence, unspecified, uncomplicated: Secondary | ICD-10-CM | POA: Insufficient documentation

## 2020-05-15 DIAGNOSIS — Z789 Other specified health status: Secondary | ICD-10-CM

## 2020-05-15 DIAGNOSIS — Z046 Encounter for general psychiatric examination, requested by authority: Secondary | ICD-10-CM | POA: Insufficient documentation

## 2020-05-15 DIAGNOSIS — F1090 Alcohol use, unspecified, uncomplicated: Secondary | ICD-10-CM
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
## 2020-05-15 NOTE — ED Notes (Signed)
Red tshirt Black joggers Pink sneakers [employer] All locked in [facility] locker area

## 2020-05-16 DIAGNOSIS — F4325 Adjustment disorder with mixed disturbance of emotions and conduct: Secondary | ICD-10-CM

## 2020-05-16 LAB — COMPREHENSIVE METABOLIC PANEL
ALT: 16 U/L (ref 0–44)
AST: 24 U/L (ref 15–41)
Albumin: 3.8 g/dL (ref 3.5–5.0)
Alkaline Phosphatase: 34 U/L — ABNORMAL LOW (ref 38–126)
Anion gap: 9 (ref 5–15)
BUN: 12 mg/dL (ref 6–20)
CO2: 18 mmol/L — ABNORMAL LOW (ref 22–32)
Calcium: 8.6 mg/dL — ABNORMAL LOW (ref 8.9–10.3)
Chloride: 107 mmol/L (ref 98–111)
Creatinine, Ser: 0.7 mg/dL (ref 0.44–1.00)
GFR, Estimated: >60 mL/min (ref >60)
Glucose, Bld: 87 mg/dL (ref 70–99)
Potassium: 3.4 mmol/L — ABNORMAL LOW (ref 3.5–5.1)
Sodium: 134 mmol/L — ABNORMAL LOW (ref 135–145)
Total Bilirubin: 0.9 mg/dL (ref 0.3–1.2)
Total Protein: 7.4 g/dL (ref 6.5–8.1)

## 2020-05-16 LAB — URINALYSIS, COMPLETE (UACMP) WITH MICROSCOPIC
Bilirubin Urine: NEGATIVE
Glucose, UA: NEGATIVE mg/dL
Hgb urine dipstick: NEGATIVE
Ketones, ur: 5 mg/dL — AB
Leukocytes,Ua: NEGATIVE
Nitrite: NEGATIVE
Protein, ur: NEGATIVE mg/dL
Specific Gravity, Urine: 1.006 (ref 1.005–1.030)
pH: 6 (ref 5.0–8.0)

## 2020-05-16 LAB — RESP PANEL BY RT-PCR (FLU A&B, COVID) ARPGX2
Influenza A by PCR: NEGATIVE
Influenza B by PCR: NEGATIVE
SARS Coronavirus 2 by RT PCR: NEGATIVE

## 2020-05-16 LAB — CBC
HCT: 38.2 % (ref 36.0–46.0)
Hemoglobin: 13 g/dL (ref 12.0–15.0)
MCH: 33.1 pg (ref 26.0–34.0)
MCHC: 34 g/dL (ref 30.0–36.0)
MCV: 97.2 fL (ref 80.0–100.0)
Platelets: 257 10*3/uL (ref 150–400)
RBC: 3.93 MIL/uL (ref 3.87–5.11)
RDW: 11.7 % (ref 11.5–15.5)
WBC: 5.2 10*3/uL (ref 4.0–10.5)
nRBC: 0 % (ref 0.0–0.2)

## 2020-05-16 LAB — POC URINE PREG, ED: Preg Test, Ur: NEGATIVE

## 2020-05-16 LAB — URINE DRUG SCREEN, QUALITATIVE (ARMC ONLY)
Amphetamines, Ur Screen: NOT DETECTED
Barbiturates, Ur Screen: NOT DETECTED
Benzodiazepine, Ur Scrn: NOT DETECTED
Cannabinoid 50 Ng, Ur ~~LOC~~: NOT DETECTED
Cocaine Metabolite,Ur ~~LOC~~: NOT DETECTED
MDMA (Ecstasy)Ur Screen: NOT DETECTED
Methadone Scn, Ur: NOT DETECTED
Opiate, Ur Screen: NOT DETECTED
Phencyclidine (PCP) Ur S: NOT DETECTED
Tricyclic, Ur Screen: NOT DETECTED

## 2020-05-16 LAB — ETHANOL: Alcohol, Ethyl (B): <10 mg/dL (ref <10)

## 2020-05-16 NOTE — ED Notes (Signed)
Patient to nursing station banging on window yelling at nurse tech and security about shutting og her TV. Patient stated she asked to have it shut off a while ago, demanding someone come into her room and shut it off, patient was advised tv would be shut of, patient continue to argue making states like she is here against her will and that if she doesn't get out of here she may loose her job,patient expressing these concerns in a very loud and angry tone.

## 2020-05-16 NOTE — ED Notes (Signed)
Patient evaluated by psych and discharged to home

## 2020-05-16 NOTE — ED Notes (Addendum)
Gave patient toothbrush,toothpaste,deodorant, new scrub top,lotion, and wash cloth to clean up. Patient requested those items and some water to drink.

## 2020-05-16 NOTE — ED Provider Notes (Signed)
Patient cleared by Dr. Toni Amend for discharge.   Gilles Chiquito, MD 05/16/20 1538

## 2020-05-16 NOTE — ED Notes (Signed)
Rescinded by Dr. Toni Amend 1500

## 2020-05-16 NOTE — ED Notes (Signed)
Patient transferred to [facility] pending psych eval. Patient knocking at unit station every 5 minutes with questions and request. Patient advised that psych will be in to consult her sometime during the day, no specific time frame noted for psychiatrist scheduled. Patient snatched  cup out of psych tech  hand in a very spilling juice onto herself during breakfast pass. Will continue to monitor her behavior.

## 2020-05-16 NOTE — Consult Note (Signed)
[facility] Face-to-Face Psychiatry Consult   Reason for Consult: Consult for 25 year old woman brought here under IVC alleging suicidal ideation Referring Physician: Katrinka Blazing Patient Identification: Martha Mercado MRN:  [id number] Principal Diagnosis: Adjustment disorder with mixed disturbance of emotions and conduct Diagnosis:  Principal Problem:   Adjustment disorder with mixed disturbance of emotions and conduct Active Problems:   Methamphetamine abuse (HCC)   Methamphetamine-induced psychotic disorder (HCC)   Alcohol use q weekend   Total Time spent with patient: 1 hour  Subjective:   Martha Mercado is a 25 y.o. female patient admitted with "my father just lied to get me out of the house".  HPI: Patient seen chart reviewed.  25 year old woman with a history of substance abuse brought in under IVC filed by the family stating that she had made suicidal statements.  Patient says that yesterday she got into an argument with her father and called him by a very rude name.  He got angry and called the police.  Police came to the house but found nothing that needed to be done.  Patient went to bed but then was later woken up by the police again after the father apparently went to the magistrate and filed commitment papers.  Patient absolutely denies making any suicidal statements or doing anything to harm her self.  States her mood is been somewhat irritated at home but denies being depressed.  Denies suicidal or homicidal thought.  Denies psychotic symptoms.  States that she has been working with outpatient mental health providers to stay clean and has stayed off of drugs for a couple weeks now.  She is working regularly.  Patient presents as calm and appropriate.  Past Psychiatric History: Patient has a past history of drug abuse with previous assessment in the emergency room on amphetamines.  Currently her drug test is entirely negative.  No history of suicide attempts  Risk to Self:   Risk to  Others:   Prior Inpatient Therapy:   Prior Outpatient Therapy:    Past Medical History:  Past Medical History:  Diagnosis Date  . Dysmenorrhea    No past surgical history on file. Family History:  Family History  Problem Relation Age of Onset  . Hypertension Maternal Grandmother   . Hypertension Maternal Grandfather   . Diabetes Paternal Grandfather   . Heart disease Paternal Grandfather    Family Psychiatric  History: None known Social History:  Social History   Substance and Sexual Activity  Alcohol Use Not Currently  . Alcohol/week: 0.0 standard drinks     Social History   Substance and Sexual Activity  Drug Use Yes  . Types: Cocaine, Marijuana, Methamphetamines   Comment: last used meth and cocaine 2 years ago, still uses marijuana     Social History   Socioeconomic History  . Marital status: Single    Spouse name: Not on file  . Number of children: Not on file  . Years of education: Not on file  . Highest education level: Not on file  Occupational History  . Not on file  Tobacco Use  . Smoking status: Current Some Day Smoker    Years: 3.00  . Smokeless tobacco: Never Used  Vaping Use  . Vaping Use: Former  Substance and Sexual Activity  . Alcohol use: Not Currently    Alcohol/week: 0.0 standard drinks  . Drug use: Yes    Types: Cocaine, Marijuana, Methamphetamines    Comment: last used meth and cocaine 2 years ago, still uses marijuana   .  Sexual activity: Yes    Birth control/protection: None  Other Topics Concern  . Not on file  Social History Narrative  . Not on file   Social Determinants of Health   Financial Resource Strain: Not on file  Food Insecurity: Not on file  Transportation Needs: Not on file  Physical Activity: Not on file  Stress: Not on file  Social Connections: Not on file   Additional Social History:    Allergies:  No Known Allergies  Labs:  Results for orders placed or performed during the hospital encounter of  05/15/20 (from the past 48 hour(s))  Comprehensive metabolic panel     Status: Abnormal   Collection Time: 05/15/20 11:37 PM  Result Value Ref Range   Sodium 134 (L) 135 - 145 mmol/L   Potassium 3.4 (L) 3.5 - 5.1 mmol/L   Chloride 107 98 - 111 mmol/L   CO2 18 (L) 22 - 32 mmol/L   Glucose, Bld 87 70 - 99 mg/dL    Comment: Glucose reference range applies only to samples taken after fasting for at least 8 hours.   BUN 12 6 - 20 mg/dL   Creatinine, Ser 1.61 0.44 - 1.00 mg/dL   Calcium 8.6 (L) 8.9 - 10.3 mg/dL   Total Protein 7.4 6.5 - 8.1 g/dL   Albumin 3.8 3.5 - 5.0 g/dL   AST 24 15 - 41 U/L   ALT 16 0 - 44 U/L   Alkaline Phosphatase 34 (L) 38 - 126 U/L   Total Bilirubin 0.9 0.3 - 1.2 mg/dL   GFR, Estimated >09 >60 mL/min    Comment: (NOTE) Calculated using the CKD-EPI Creatinine Equation (2021)    Anion gap 9 5 - 15    Comment: Performed at [facility], [address] [place], [place] [address]  Ethanol     Status: None   Collection Time: 05/15/20 11:37 PM  Result Value Ref Range   Alcohol, Ethyl (B) <10 <10 mg/dL    Comment: (NOTE) Lowest detectable limit for serum alcohol is 10 mg/dL.  For medical purposes only. Performed at [facility], [address] [place], [place] [address]   Urinalysis, Complete w Microscopic     Status: Abnormal   Collection Time: 05/15/20 11:37 PM  Result Value Ref Range   Color, Urine STRAW (A) YELLOW   APPearance CLEAR (A) CLEAR   Specific Gravity, Urine 1.006 1.005 - 1.030   pH 6.0 5.0 - 8.0   Glucose, UA NEGATIVE NEGATIVE mg/dL   Hgb urine dipstick NEGATIVE NEGATIVE   Bilirubin Urine NEGATIVE NEGATIVE   Ketones, ur 5 (A) NEGATIVE mg/dL   Protein, ur NEGATIVE NEGATIVE mg/dL   Nitrite NEGATIVE NEGATIVE   Leukocytes,Ua NEGATIVE NEGATIVE   WBC, UA 0-5 0 - 5 WBC/hpf   Bacteria, UA RARE (A) NONE SEEN   Squamous Epithelial / LPF 0-5 0 - 5    Comment: Performed at [facility], [address],  [place], [place] [address]  Urine Drug Screen, Qualitative ([facility] only)     Status: None   Collection Time: 05/15/20 11:37 PM  Result Value Ref Range   Tricyclic, Ur Screen NONE DETECTED NONE DETECTED   Amphetamines, Ur Screen NONE DETECTED NONE DETECTED   MDMA (Ecstasy)Ur Screen NONE DETECTED NONE DETECTED   Cocaine Metabolite,Ur [place] NONE DETECTED NONE DETECTED   Opiate, Ur Screen NONE DETECTED NONE DETECTED   Phencyclidine (PCP) Ur S NONE DETECTED NONE DETECTED   Cannabinoid 50 Ng, Ur [facility] NONE  DETECTED NONE DETECTED   Barbiturates, Ur Screen NONE DETECTED NONE DETECTED   Benzodiazepine, Ur Scrn NONE DETECTED NONE DETECTED   Methadone Scn, Ur NONE DETECTED NONE DETECTED    Comment: (NOTE) Tricyclics + metabolites, urine    Cutoff 1000 ng/mL Amphetamines + metabolites, urine  Cutoff 1000 ng/mL MDMA (Ecstasy), urine              Cutoff 500 ng/mL Cocaine Metabolite, urine          Cutoff 300 ng/mL Opiate + metabolites, urine        Cutoff 300 ng/mL Phencyclidine (PCP), urine         Cutoff 25 ng/mL Cannabinoid, urine                 Cutoff 50 ng/mL Barbiturates + metabolites, urine  Cutoff 200 ng/mL Benzodiazepine, urine              Cutoff 200 ng/mL Methadone, urine                   Cutoff 300 ng/mL  The urine drug screen provides only a preliminary, unconfirmed analytical test result and should not be used for non-medical purposes. Clinical consideration and professional judgment should be applied to any positive drug screen result due to possible interfering substances. A more specific alternate chemical method must be used in order to obtain a confirmed analytical result. Gas chromatography / mass spectrometry (GC/MS) is the preferred confirm atory method. Performed at [facility], [address] [place], [place] [address]   Resp Panel by RT-PCR (Flu A&B, Covid) Nasopharyngeal Swab     Status: None   Collection Time: 05/15/20 11:51 PM   Specimen: Nasopharyngeal Swab;  Nasopharyngeal(NP) swabs in vial transport medium  Result Value Ref Range   SARS Coronavirus 2 by RT PCR NEGATIVE NEGATIVE    Comment: (NOTE) SARS-CoV-2 target nucleic acids are NOT DETECTED.  The SARS-CoV-2 RNA is generally detectable in upper respiratory specimens during the acute phase of infection. The lowest concentration of SARS-CoV-2 viral copies this assay can detect is 138 copies/mL. A negative result does not preclude SARS-Cov-2 infection and should not be used as the sole basis for treatment or other patient management decisions. A negative result may occur with  improper specimen collection/handling, submission of specimen other than nasopharyngeal swab, presence of viral mutation(s) within the areas targeted by this assay, and inadequate number of viral copies(<138 copies/mL). A negative result must be combined with clinical observations, patient history, and epidemiological information. The expected result is Negative.  Fact Sheet for Patients:  BloggerCourse.com  Fact Sheet for Healthcare Providers:  SeriousBroker.it  This test is no t yet approved or cleared by the [place] FDA and  has been authorized for detection and/or diagnosis of SARS-CoV-2 by FDA under an Emergency Use Authorization (EUA). This EUA will remain  in effect (meaning this test can be used) for the duration of the COVID-19 declaration under Section 564(b)(1) of the Act, 21 U.S.C.section 360bbb-3(b)(1), unless the authorization is terminated  or revoked sooner.       Influenza A by PCR NEGATIVE NEGATIVE   Influenza B by PCR NEGATIVE NEGATIVE    Comment: (NOTE) The Xpert Xpress SARS-CoV-2/FLU/RSV plus assay is intended as an aid in the diagnosis of influenza from Nasopharyngeal swab specimens and should not be used as a sole basis for treatment. Nasal washings and aspirates are unacceptable for Xpert Xpress  SARS-CoV-2/FLU/RSV testing.  Fact Sheet for Patients: BloggerCourse.com  Fact Sheet for Healthcare Providers: SeriousBroker.ithttps://www.fda.gov/media/152162/download  This test is not yet approved or cleared by the [place] FDA and has been authorized for detection and/or diagnosis of SARS-CoV-2 by FDA under an Emergency Use Authorization (EUA). This EUA will remain in effect (meaning this test can be used) for the duration of the COVID-19 declaration under Section 564(b)(1) of the Act, 21 U.S.C. section 360bbb-3(b)(1), unless the authorization is terminated or revoked.  Performed at [facility], [address] [place], [place] [address]   CBC     Status: None   Collection Time: 05/16/20  1:03 AM  Result Value Ref Range   WBC 5.2 4.0 - 10.5 K/uL   RBC 3.93 3.87 - 5.11 MIL/uL   Hemoglobin 13.0 12.0 - 15.0 g/dL   HCT 60.438.2 54.036.0 - 98.146.0 %   MCV 97.2 80.0 - 100.0 fL   MCH 33.1 26.0 - 34.0 pg   MCHC 34.0 30.0 - 36.0 g/dL   RDW 19.111.7 47.811.5 - 29.515.5 %   Platelets 257 150 - 400 K/uL   nRBC 0.0 0.0 - 0.2 %    Comment: Performed at [facility], [address] [place], [place] [address]  POC Urine Pregnancy, ED     Status: None   Collection Time: 05/16/20  1:29 AM  Result Value Ref Range   Preg Test, Ur NEGATIVE NEGATIVE    Comment:        THE SENSITIVITY OF THIS METHODOLOGY IS >24 mIU/mL     No current facility-administered medications for this encounter.   No current outpatient medications on file.    Musculoskeletal: Strength & Muscle Tone: within normal limits Gait & Station: normal Patient leans: N/A  Psychiatric Specialty Exam: Physical Exam Vitals and nursing note reviewed.  Constitutional:      Appearance: She is well-developed and well-nourished.  HENT:     Head: Normocephalic and atraumatic.  Eyes:     Conjunctiva/sclera: Conjunctivae normal.     Pupils: Pupils are equal, round, and reactive to light.  Cardiovascular:      Heart sounds: Normal heart sounds.  Pulmonary:     Effort: Pulmonary effort is normal.  Abdominal:     Palpations: Abdomen is soft.  Musculoskeletal:        General: Normal range of motion.     Cervical back: Normal range of motion.  Skin:    General: Skin is warm and dry.  Neurological:     General: No focal deficit present.     Mental Status: She is alert.  Psychiatric:        Mood and Affect: Mood normal.        Thought Content: Thought content normal.     Review of Systems  Constitutional: Negative.   HENT: Negative.   Eyes: Negative.   Respiratory: Negative.   Cardiovascular: Negative.   Gastrointestinal: Negative.   Musculoskeletal: Negative.   Skin: Negative.   Neurological: Negative.   Psychiatric/Behavioral: Negative.     Blood pressure 104/71, pulse 72, temperature 98.4 F (36.9 C), temperature source Oral, resp. rate 16, height 5\' 7"  (1.702 m), weight 59 kg, last menstrual period 04/11/2020, SpO2 99 %.Body mass index is 20.36 kg/m.  General Appearance: Casual  Eye Contact:  Good  Speech:  Clear and Coherent  Volume:  Normal  Mood:  Euthymic  Affect:  Constricted  Thought Process:  Goal Directed  Orientation:  Full (Time, Place, and Person)  Thought Content:  Logical  Suicidal Thoughts:  No  Homicidal Thoughts:  No  Memory:  Immediate;   Fair Recent;   Fair Remote;   Fair  Judgement:  Fair  Insight:  Fair  Psychomotor Activity:  Normal  Concentration:  Concentration: Fair  Recall:  [employer] of Knowledge:  Fair  Language:  Fair  Akathisia:  No  Handed:  Right  AIMS (if indicated):     Assets:  Desire for Improvement  ADL's:  Intact  Cognition:  WNL  Sleep:        Treatment Plan Summary: Plan Patient does not meet commitment criteria.  Supportive counseling and encouragement to continue her efforts at staying sober.  Discontinue IVC.  Follow-up with outpatient treatment at [facility].  Case reviewed with emergency room physician and  TTS.  Disposition: Patient does not meet criteria for psychiatric inpatient admission.  Mordecai Rasmussen, MD 05/16/2020 5:14 PM
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
## 2020-05-16 NOTE — ED Provider Notes (Signed)
[facility] Emergency Department Provider Note  ____________________________________________   Event Date/Time   First MD Initiated Contact with Patient 05/16/20 0005     (approximate)  I have reviewed the triage vital signs and the nursing notes.   HISTORY  Chief Complaint Psychiatric Evaluation  Level 5 caveat: The patient is minimally cooperative with my interview and exam and will provide only minimal details about what happened.  HPI Martha Mercado is a 25 y.o. female with medical history as listed below which notably includes prior psychiatric admission for depression.  She presents under involuntary commitment.  Allegedly there was a verbal altercation with her family where she threatened to kill herself and/or her family.  She went to [facility] and they placed her under involuntary commitment and she was brought here by Patent examiner.  She denies all of this.  She claims that she called her father a "cocksucker" and that made him mad so he lied about her and had her committed.  She refuses answer any other questions other than to say she has no medical complaints and she is just tired.         Past Medical History:  Diagnosis Date  . Dysmenorrhea     Patient Active Problem List   Diagnosis Date Noted  . Marijuana use 07/12/2019  . Alcohol use q weekend 07/12/2019  . Trichomonas infection 07/12/2019  . Methamphetamine-induced psychotic disorder (HCC)   . 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 Blood in stool 08/15/2016  . Ovarian cyst, right 08/15/2016  . Chlamydia infection 12/29/2014    No past surgical history on file.  Prior to Admission medications   Not on File    Allergies Patient has no known allergies.  Family History  Problem Relation Age of Onset  . Hypertension Maternal Grandmother   . Hypertension Maternal Grandfather   . Diabetes Paternal Grandfather   . Heart disease Paternal Grandfather     Social  History Social History   Tobacco Use  . Smoking status: Current Some Day Smoker    Years: 3.00  . Smokeless tobacco: Never Used  Vaping Use  . Vaping Use: Former  Substance Use Topics  . Alcohol use: Not Currently    Alcohol/week: 0.0 standard drinks  . Drug use: Yes    Types: Cocaine, Marijuana, Methamphetamines    Comment: last used meth and cocaine 2 years ago, still uses marijuana     Review of Systems Level 5 caveat: The patient is minimally cooperative with my interview and exam and will provide only minimal details about what happened.  Allegedly threatened to kill herself and/or her family, a claim which she denies.  ____________________________________________   PHYSICAL EXAM:  VITAL SIGNS: ED Triage Vitals [05/15/20 2333]  Enc Vitals Group     BP (!) 132/97     Pulse Rate 71     Resp 20     Temp 98.6 F (37 C)     Temp Source Oral     SpO2 100 %     Weight 59 kg (130 lb)     Height 1.702 m (5\' 7" )     Head Circumference      Peak Flow      Pain Score 0     Pain Loc      Pain Edu?      Excl. in GC?     Constitutional: Alert and oriented.  Eyes: Conjunctivae are normal.  Head: Atraumatic. Nose: No congestion/rhinnorhea. Mouth/Throat: Patient  is wearing a mask. Neck: No stridor.  No meningeal signs.   Cardiovascular: Normal rate, regular rhythm.  Respiratory: Normal respiratory effort.  No retractions. Gastrointestinal: Soft and nondistended. Musculoskeletal: No lower extremity tenderness nor edema. No gross deformities of extremities. Neurologic:  Normal speech and language. No gross focal neurologic deficits are appreciated.  Skin:  Skin is warm, dry and intact. Psychiatric: Patient is minimally communicative, difficult to assess based on her refusal to converse with me.  ____________________________________________   LABS (all labs ordered are listed, but only abnormal results are displayed)  Labs Reviewed  COMPREHENSIVE METABOLIC PANEL -  Abnormal; Notable for the following components:      Result Value   Sodium 134 (*)    Potassium 3.4 (*)    CO2 18 (*)    Calcium 8.6 (*)    Alkaline Phosphatase 34 (*)    All other components within normal limits  URINALYSIS, COMPLETE (UACMP) WITH MICROSCOPIC - Abnormal; Notable for the following components:   Color, Urine STRAW (*)    APPearance CLEAR (*)    Ketones, ur 5 (*)    Bacteria, UA RARE (*)    All other components within normal limits  RESP PANEL BY RT-PCR (FLU A&B, COVID) ARPGX2  ETHANOL  URINE DRUG SCREEN, QUALITATIVE ([facility] ONLY)  CBC  POC URINE PREG, ED   ____________________________________________  EKG  None - EKG not ordered by ED physician ____________________________________________  RADIOLOGY Marylou Mccoy, personally viewed and evaluated these images (plain radiographs) as part of my medical decision making, as well as reviewing the written report by the radiologist.  ED MD interpretation: Insert no imaging  Official radiology report(s): No results found.  ____________________________________________   PROCEDURES   Procedure(s) performed (including Critical Care):  Procedures   ____________________________________________   INITIAL IMPRESSION / MDM / ASSESSMENT AND PLAN / ED COURSE  As part of my medical decision making, I reviewed the following data within the electronic MEDICAL RECORD NUMBER Nursing notes reviewed and incorporated, Labs reviewed , A consult was requested and obtained from this/these consultant(s) Psychiatry and Notes from prior ED visits   Differential diagnosis includes, but is not limited to, depression, mood disorder, adjustment disorder, substance abuse.  Patient minimally cooperative but vital signs are within normal limits and she has no complaints or concerns.  Lab work is within normal limits other than a very slightly decreased CO2 and potassium.  No evidence of urinary tract infection.  Urine drug screen is  negative and ethanol level is negative.  Patient placed under IVC at [facility].  I will uphold the IVC and consult psychiatry and TTS for further assessment and disposition plans.  The patient has been placed in psychiatric observation due to the need to provide a safe environment for the patient while obtaining psychiatric consultation and evaluation, as well as ongoing medical and medication management to treat the patient's condition.  The patient has been placed under full IVC at this time.     ____________________________________________  FINAL CLINICAL IMPRESSION(S) / ED DIAGNOSES  Final diagnoses:  Suicidal ideation     MEDICATIONS GIVEN DURING THIS VISIT:  Medications - No data to display   ED Discharge Orders    None      *Please note:  Martha Mercado was evaluated in Emergency Department on 05/16/2020 for the symptoms described in the history of present illness. She was evaluated in the context of the global COVID-19 pandemic, which necessitated consideration that the patient might be at risk  for infection with the SARS-CoV-2 virus that causes COVID-19. Institutional protocols and algorithms that pertain to the evaluation of patients at risk for COVID-19 are in a state of rapid change based on information released by regulatory bodies including the CDC and federal and state organizations. These policies and algorithms were followed during the patient's care in the ED.  Some ED evaluations and interventions may be delayed as a result of limited staffing during and after the pandemic.*  Note:  This document was prepared using Dragon voice recognition software and may include unintentional dictation errors.   Loleta Rose, MD 05/16/20 [phone]

## 2020-10-05 DIAGNOSIS — O26891 Other specified pregnancy related conditions, first trimester: Secondary | ICD-10-CM | POA: Diagnosis not present

## 2020-10-05 DIAGNOSIS — R1013 Epigastric pain: Secondary | ICD-10-CM | POA: Diagnosis not present

## 2020-10-05 DIAGNOSIS — Z3A01 Less than 8 weeks gestation of pregnancy: Secondary | ICD-10-CM | POA: Diagnosis not present

## 2020-10-26 DIAGNOSIS — N8312 Corpus luteum cyst of left ovary: Secondary | ICD-10-CM | POA: Diagnosis not present

## 2020-10-26 DIAGNOSIS — O021 Missed abortion: Secondary | ICD-10-CM | POA: Diagnosis not present

## 2020-10-26 DIAGNOSIS — O200 Threatened abortion: Secondary | ICD-10-CM | POA: Diagnosis not present

## 2020-10-26 DIAGNOSIS — Z3A09 9 weeks gestation of pregnancy: Secondary | ICD-10-CM | POA: Diagnosis not present

## 2020-10-27 DIAGNOSIS — Z20822 Contact with and (suspected) exposure to covid-19: Secondary | ICD-10-CM | POA: Diagnosis not present

## 2020-10-27 DIAGNOSIS — G4489 Other headache syndrome: Secondary | ICD-10-CM | POA: Diagnosis not present

## 2020-10-27 DIAGNOSIS — R1084 Generalized abdominal pain: Secondary | ICD-10-CM | POA: Diagnosis not present

## 2020-10-27 DIAGNOSIS — F4320 Adjustment disorder, unspecified: Secondary | ICD-10-CM | POA: Diagnosis not present

## 2020-10-27 DIAGNOSIS — O039 Complete or unspecified spontaneous abortion without complication: Secondary | ICD-10-CM | POA: Diagnosis not present

## 2020-10-27 DIAGNOSIS — R519 Headache, unspecified: Secondary | ICD-10-CM | POA: Diagnosis not present

## 2020-10-27 DIAGNOSIS — R45851 Suicidal ideations: Secondary | ICD-10-CM | POA: Diagnosis not present

## 2020-10-27 DIAGNOSIS — R1013 Epigastric pain: Secondary | ICD-10-CM | POA: Diagnosis not present

## 2020-10-28 DIAGNOSIS — Z20822 Contact with and (suspected) exposure to covid-19: Secondary | ICD-10-CM | POA: Diagnosis not present

## 2020-10-28 DIAGNOSIS — F4320 Adjustment disorder, unspecified: Secondary | ICD-10-CM | POA: Diagnosis not present

## 2020-10-28 DIAGNOSIS — O039 Complete or unspecified spontaneous abortion without complication: Secondary | ICD-10-CM | POA: Diagnosis not present

## 2020-10-28 DIAGNOSIS — F1490 Cocaine use, unspecified, uncomplicated: Secondary | ICD-10-CM | POA: Insufficient documentation

## 2020-10-28 DIAGNOSIS — Z7289 Other problems related to lifestyle: Secondary | ICD-10-CM | POA: Diagnosis not present

## 2020-10-28 DIAGNOSIS — R45851 Suicidal ideations: Secondary | ICD-10-CM | POA: Diagnosis not present

## 2020-10-29 DIAGNOSIS — I498 Other specified cardiac arrhythmias: Secondary | ICD-10-CM | POA: Diagnosis not present

## 2020-11-10 ENCOUNTER — Other Ambulatory Visit: Payer: Self-pay

## 2020-11-10 ENCOUNTER — Encounter (HOSPITAL_COMMUNITY): Payer: Self-pay

## 2020-11-10 ENCOUNTER — Emergency Department (HOSPITAL_COMMUNITY)
Admission: EM | Admit: 2020-11-10 | Discharge: 2020-11-11 | Disposition: A | Discharge status: Home / Self Care | Payer: Medicaid Other | Attending: Emergency Medicine | Admitting: Emergency Medicine

## 2020-11-10 DIAGNOSIS — O200 Threatened abortion: Secondary | ICD-10-CM | POA: Insufficient documentation

## 2020-11-10 DIAGNOSIS — F17210 Nicotine dependence, cigarettes, uncomplicated: Secondary | ICD-10-CM | POA: Insufficient documentation

## 2020-11-10 DIAGNOSIS — O039 Complete or unspecified spontaneous abortion without complication: Secondary | ICD-10-CM

## 2020-11-10 DIAGNOSIS — Z3A08 8 weeks gestation of pregnancy: Secondary | ICD-10-CM | POA: Insufficient documentation

## 2020-11-10 DIAGNOSIS — O209 Hemorrhage in early pregnancy, unspecified: Secondary | ICD-10-CM | POA: Diagnosis not present

## 2020-11-10 DIAGNOSIS — Z3A01 Less than 8 weeks gestation of pregnancy: Secondary | ICD-10-CM | POA: Diagnosis not present

## 2020-11-10 DIAGNOSIS — Z3A10 10 weeks gestation of pregnancy: Secondary | ICD-10-CM | POA: Diagnosis not present

## 2020-11-10 NOTE — ED Triage Notes (Signed)
Pt reports being diagnosed with miscarriage on 8/11. Was supposed to follow up with OB but did not. Bleeding returned today. Using tampons (10-12 today).

## 2020-11-11 ENCOUNTER — Emergency Department (HOSPITAL_COMMUNITY): Payer: Medicaid Other

## 2020-11-11 DIAGNOSIS — O209 Hemorrhage in early pregnancy, unspecified: Secondary | ICD-10-CM | POA: Diagnosis not present

## 2020-11-11 LAB — CBC WITH DIFFERENTIAL/PLATELET
Abs Immature Granulocytes: 0.13 10*3/uL — ABNORMAL HIGH (ref 0.00–0.07)
Basophils Absolute: 0 10*3/uL (ref 0.0–0.1)
Basophils Relative: 0 %
Eosinophils Absolute: 0.1 10*3/uL (ref 0.0–0.5)
Eosinophils Relative: 1 %
HCT: 38.4 % (ref 36.0–46.0)
Hemoglobin: 13.3 g/dL (ref 12.0–15.0)
Immature Granulocytes: 2 %
Lymphocytes Relative: 10 %
Lymphs Abs: 0.8 10*3/uL (ref 0.7–4.0)
MCH: 33.5 pg (ref 26.0–34.0)
MCHC: 34.6 g/dL (ref 30.0–36.0)
MCV: 96.7 fL (ref 80.0–100.0)
Monocytes Absolute: 0.4 10*3/uL (ref 0.1–1.0)
Monocytes Relative: 5 %
Neutro Abs: 6.5 10*3/uL (ref 1.7–7.7)
Neutrophils Relative %: 82 %
Platelets: 287 10*3/uL (ref 150–400)
RBC: 3.97 MIL/uL (ref 3.87–5.11)
RDW: 12 % (ref 11.5–15.5)
WBC: 8 10*3/uL (ref 4.0–10.5)
nRBC: 0 % (ref 0.0–0.2)

## 2020-11-11 LAB — COMPREHENSIVE METABOLIC PANEL
ALT: 16 U/L (ref 0–44)
AST: 21 U/L (ref 15–41)
Albumin: 3.9 g/dL (ref 3.5–5.0)
Alkaline Phosphatase: 33 U/L — ABNORMAL LOW (ref 38–126)
Anion gap: 8 (ref 5–15)
BUN: 11 mg/dL (ref 6–20)
CO2: 26 mmol/L (ref 22–32)
Calcium: 9.2 mg/dL (ref 8.9–10.3)
Chloride: 103 mmol/L (ref 98–111)
Creatinine, Ser: 0.64 mg/dL (ref 0.44–1.00)
GFR, Estimated: >60 mL/min (ref >60)
Glucose, Bld: 109 mg/dL — ABNORMAL HIGH (ref 70–99)
Potassium: 4 mmol/L (ref 3.5–5.1)
Sodium: 137 mmol/L (ref 135–145)
Total Bilirubin: 0.7 mg/dL (ref 0.3–1.2)
Total Protein: 7.4 g/dL (ref 6.5–8.1)

## 2020-11-11 LAB — HCG, QUANTITATIVE, PREGNANCY: hCG, Beta Chain, Quant, S: 3824 m[IU]/mL — ABNORMAL HIGH (ref <5)

## 2020-11-11 LAB — ABO/RH: ABO/RH(D): A POS

## 2020-11-11 MED ORDER — ACETAMINOPHEN 500 MG PO TABS
1000.0000 mg | ORAL_TABLET | Freq: Once | ORAL | Status: AC
  Administered 2020-11-11: 1000 mg via ORAL
  Filled 2020-11-11: qty 2

## 2020-11-11 MED ORDER — IBUPROFEN 800 MG PO TABS: 800.0000 mg | ORAL_TABLET | Freq: Three times a day (TID) | ORAL | 0 refills | Status: DC

## 2020-11-11 NOTE — Discharge Instructions (Addendum)
Bleeding may continue for a few days. Please follow-up with OB-GYN to ensure hormone levels go back to normal range. Return here for new concerns.

## 2020-11-11 NOTE — ED Provider Notes (Signed)
[facility]-EMERGENCY DEPT Provider Note   CSN: [id number] Arrival date & time: 11/10/20  2326     History Chief Complaint  Patient presents with   Vaginal Bleeding    Martha Mercado is a 25 y.o. female.  The history is provided by the patient and medical records.  Vaginal Bleeding  25 y.o. F here with concern for heavy vaginal bleeding.  States she was seen in [facility] in [place] 10/26/20 and diagnosed with miscarriage at [redacted] weeks gestation as fetus did not have a heartbeat.  States she was supposed to follow-up but never did.  Today started having heavy vaginal bleeding, using 10-12 tampons today and passed what she thinks was a small fetus as it did have eyes, arms, and legs.  States since then some worsening cramping.  Denies fever, chills, sweats.  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 No pertinent surgical history.   OB History     Gravida  0   Para  0   Term  0   Preterm  0   AB  0   Living  0      SAB  0   IAB  0   Ectopic  0   Multiple  0   Live Births  0           Family History  Problem Relation Age of Onset   Hypertension Maternal Grandmother    Hypertension Maternal Grandfather    Diabetes Paternal Grandfather    Heart disease Paternal Grandfather     Social History   Tobacco Use   Smoking status: Some Days    Years: 3.00    Types: Cigarettes   Smokeless tobacco: Never  Vaping Use   Vaping Use: Former  Substance Use Topics   Alcohol use: Not  Currently    Alcohol/week: 0.0 standard drinks   Drug use: Yes    Types: Cocaine, Marijuana, Methamphetamines    Comment: last used meth and cocaine 2 years ago, still uses marijuana     Home Medications Prior to Admission medications   Not on File    Allergies    Patient has no known allergies.  Review of Systems   Review of Systems  Genitourinary:  Positive for vaginal bleeding.  All other systems reviewed and are negative.  Physical Exam Updated Vital Signs BP 130/75   Pulse 69   Temp 98.4 F (36.9 C) (Oral)   Resp 18   SpO2 100%   Physical Exam Vitals and nursing note reviewed.  Constitutional:      Appearance: She is well-developed.  HENT:     Head: Normocephalic and atraumatic.  Eyes:     Conjunctiva/sclera: Conjunctivae normal.     Pupils: Pupils are equal, round, and reactive to light.  Cardiovascular:     Rate and Rhythm: Normal rate and regular rhythm.     Heart sounds: Normal heart sounds.  Pulmonary:     Effort: Pulmonary effort is normal.     Breath sounds: Normal  breath sounds.  Abdominal:     General: Bowel sounds are normal.     Palpations: Abdomen is soft.  Musculoskeletal:        General: Normal range of motion.     Cervical back: Normal range of motion.  Skin:    General: Skin is warm and dry.  Neurological:     Mental Status: She is alert and oriented to person, place, and time.    ED Results / Procedures / Treatments   Labs (all labs ordered are listed, but only abnormal results are displayed) Labs Reviewed  CBC WITH DIFFERENTIAL/PLATELET - Abnormal; Notable for the following components:      Result Value   Abs Immature Granulocytes 0.13 (*)    All other components within normal limits  COMPREHENSIVE METABOLIC PANEL - Abnormal; Notable for the following components:   Glucose, Bld 109 (*)    Alkaline Phosphatase 33 (*)    All other components within normal limits  HCG, QUANTITATIVE, PREGNANCY - Abnormal; Notable for the following  components:   hCG, Beta Chain, Quant, S 3,824 (*)    All other components within normal limits  ABO/RH    EKG None  Radiology US OB Comp Less 14 Wks  Result Date: 11/11/2020 CLINICAL DATA:  Miscarriage 10/26/2020, heavy bleeding, beta HCG 3824 EXAM: OBSTETRIC <14 WK [place] AND TRANSVAGINAL OB US TECHNIQUE: Both transabdominal and transvaginal ultrasound examinations were performed for complete evaluation of the gestation as well as the maternal uterus, adnexal regions, and pelvic cul-de-sac. Transvaginal technique was performed to assess early pregnancy. COMPARISON:  None. FINDINGS: Intrauterine gestational sac: None Yolk sac:  Not Visualized. Embryo:  Not Visualized. Maternal uterus/adnexae: Thickened, heterogeneous endometrium with fluid/soft tissue/debris in the lower uterine segment/cervix. Bilateral ovaries are within normal limits. No free fluid. IMPRESSION: Thickened, heterogeneous endometrium with fluid/soft tissue/debris in the lower uterine segment/cervix. This appearance is compatible with abortion in progress given the clinical picture. Consider serial beta HCG to document appropriate decline. If the beta HCG does not decline appropriately, follow-up may be beneficial in 10-14 days (or earlier as clinically warranted). Electronically Signed   By: Charline Bills M.D.   On: 11/11/2020 03:16   US OB Transvaginal  Result Date: 11/11/2020 CLINICAL DATA:  Miscarriage 10/26/2020, heavy bleeding, beta HCG 3824 EXAM: OBSTETRIC <14 WK [place] AND TRANSVAGINAL OB US TECHNIQUE: Both transabdominal and transvaginal ultrasound examinations were performed for complete evaluation of the gestation as well as the maternal uterus, adnexal regions, and pelvic cul-de-sac. Transvaginal technique was performed to assess early pregnancy. COMPARISON:  None. FINDINGS: Intrauterine gestational sac: None Yolk sac:  Not Visualized. Embryo:  Not Visualized. Maternal uterus/adnexae: Thickened, heterogeneous endometrium with  fluid/soft tissue/debris in the lower uterine segment/cervix. Bilateral ovaries are within normal limits. No free fluid. IMPRESSION: Thickened, heterogeneous endometrium with fluid/soft tissue/debris in the lower uterine segment/cervix. This appearance is compatible with abortion in progress given the clinical picture. Consider serial beta HCG to document appropriate decline. If the beta HCG does not decline appropriately, follow-up may be beneficial in 10-14 days (or earlier as clinically warranted). Electronically Signed   By: Charline Bills M.D.   On: 11/11/2020 03:16    Procedures Procedures   Medications Ordered in ED Medications - No data to display  ED Course  I have reviewed the triage vital signs and the nursing notes.  Pertinent labs & imaging results that were available during my care of the patient were reviewed by me and considered in my medical decision making (see  chart for details).    MDM Rules/Calculators/A&P                           25 year old female presenting to the ED with heavy vaginal bleeding.  She was seen at OB/GYN clinic in [facility] on 10/26/2020 and was told she was having a miscarriage as fetus did not have heartbeat.  She was supposed to follow-up with OB/GYN but has not done so.  States heavy bleeding began today and passed some membranous tissue.  She does show me a picture of this and it does appear to be a small fetus as there are visible eyes and extremities.  She is hemodynamically stable.  Lab work is overall reassuring-- H/H stable.  Hcg still elevated at 3824.  Blood type A+, not rhogam candidiate. Will obtain US.  [place] with findings consistent of miscarriage in process.  Discussed with patient continued passage of blood and products of conception over the next few days.  She will need to follow-up with OB-GYN-- states she will return to clinic in [facility].  Given copies of labs/imaging studies for this visit for physician review.  Return here for any  new/acute changes.  Final Clinical Impression(s) / ED Diagnoses Final diagnoses:  Miscarriage    Rx / DC Orders ED Discharge Orders     None        Garlon Hatchet, PA-C 11/11/20 0603    Gilda Crease, MD 11/12/20 [phone]

## 2021-01-05 DIAGNOSIS — Z114 Encounter for screening for human immunodeficiency virus [HIV]: Secondary | ICD-10-CM | POA: Diagnosis not present

## 2021-01-05 DIAGNOSIS — N760 Acute vaginitis: Secondary | ICD-10-CM | POA: Diagnosis not present

## 2021-01-05 DIAGNOSIS — Z3009 Encounter for other general counseling and advice on contraception: Secondary | ICD-10-CM | POA: Diagnosis not present

## 2021-01-05 DIAGNOSIS — Z113 Encounter for screening for infections with a predominantly sexual mode of transmission: Secondary | ICD-10-CM | POA: Diagnosis not present

## 2021-01-10 DIAGNOSIS — S60412A Abrasion of right middle finger, initial encounter: Secondary | ICD-10-CM | POA: Diagnosis not present

## 2021-01-10 DIAGNOSIS — T148XXA Other injury of unspecified body region, initial encounter: Secondary | ICD-10-CM | POA: Diagnosis not present

## 2021-01-10 DIAGNOSIS — S60410A Abrasion of right index finger, initial encounter: Secondary | ICD-10-CM | POA: Diagnosis not present

## 2021-01-10 DIAGNOSIS — S60414A Abrasion of right ring finger, initial encounter: Secondary | ICD-10-CM | POA: Diagnosis not present

## 2021-01-10 DIAGNOSIS — S80211A Abrasion, right knee, initial encounter: Secondary | ICD-10-CM | POA: Diagnosis not present

## 2021-01-10 DIAGNOSIS — S80212A Abrasion, left knee, initial encounter: Secondary | ICD-10-CM | POA: Diagnosis not present

## 2021-01-10 DIAGNOSIS — R52 Pain, unspecified: Secondary | ICD-10-CM | POA: Diagnosis not present

## 2021-01-31 DIAGNOSIS — Z1152 Encounter for screening for COVID-19: Secondary | ICD-10-CM | POA: Diagnosis not present

## 2021-02-15 DIAGNOSIS — Z419 Encounter for procedure for purposes other than remedying health state, unspecified: Secondary | ICD-10-CM | POA: Diagnosis not present

## 2021-03-18 DIAGNOSIS — Z419 Encounter for procedure for purposes other than remedying health state, unspecified: Secondary | ICD-10-CM | POA: Diagnosis not present

## 2021-04-18 DIAGNOSIS — Z419 Encounter for procedure for purposes other than remedying health state, unspecified: Secondary | ICD-10-CM | POA: Diagnosis not present

## 2021-05-16 DIAGNOSIS — Z419 Encounter for procedure for purposes other than remedying health state, unspecified: Secondary | ICD-10-CM | POA: Diagnosis not present

## 2021-06-15 ENCOUNTER — Other Ambulatory Visit: Payer: Self-pay

## 2021-06-15 ENCOUNTER — Encounter (HOSPITAL_COMMUNITY): Payer: Self-pay | Admitting: Emergency Medicine

## 2021-06-15 ENCOUNTER — Emergency Department (HOSPITAL_COMMUNITY)
Admission: EM | Admit: 2021-06-15 | Discharge: 2021-06-16 | Disposition: A | Discharge status: Other Acute Inpatient Hospital | Payer: Medicaid Other | Source: Home / Self Care | Attending: Emergency Medicine | Admitting: Emergency Medicine

## 2021-06-15 DIAGNOSIS — Y902 Blood alcohol level of 40-59 mg/100 ml: Secondary | ICD-10-CM | POA: Insufficient documentation

## 2021-06-15 DIAGNOSIS — R45851 Suicidal ideations: Secondary | ICD-10-CM | POA: Diagnosis not present

## 2021-06-15 DIAGNOSIS — F332 Major depressive disorder, recurrent severe without psychotic features: Secondary | ICD-10-CM | POA: Insufficient documentation

## 2021-06-15 DIAGNOSIS — Z20822 Contact with and (suspected) exposure to covid-19: Secondary | ICD-10-CM | POA: Insufficient documentation

## 2021-06-15 LAB — CBC WITH DIFFERENTIAL/PLATELET
Abs Immature Granulocytes: 0.01 10*3/uL (ref 0.00–0.07)
Basophils Absolute: 0.1 10*3/uL (ref 0.0–0.1)
Basophils Relative: 1 %
Eosinophils Absolute: 0 10*3/uL (ref 0.0–0.5)
Eosinophils Relative: 0 %
HCT: 45.9 % (ref 36.0–46.0)
Hemoglobin: 16 g/dL — ABNORMAL HIGH (ref 12.0–15.0)
Immature Granulocytes: 0 %
Lymphocytes Relative: 25 %
Lymphs Abs: 1 10*3/uL (ref 0.7–4.0)
MCH: 33.2 pg (ref 26.0–34.0)
MCHC: 34.9 g/dL (ref 30.0–36.0)
MCV: 95.2 fL (ref 80.0–100.0)
Monocytes Absolute: 0.3 10*3/uL (ref 0.1–1.0)
Monocytes Relative: 7 %
Neutro Abs: 2.8 10*3/uL (ref 1.7–7.7)
Neutrophils Relative %: 67 %
Platelets: 336 10*3/uL (ref 150–400)
RBC: 4.82 MIL/uL (ref 3.87–5.11)
RDW: 12.2 % (ref 11.5–15.5)
WBC: 4.1 10*3/uL (ref 4.0–10.5)
nRBC: 0 % (ref 0.0–0.2)

## 2021-06-15 LAB — COMPREHENSIVE METABOLIC PANEL
ALT: 20 U/L (ref 0–44)
AST: 29 U/L (ref 15–41)
Albumin: 4.8 g/dL (ref 3.5–5.0)
Alkaline Phosphatase: 52 U/L (ref 38–126)
Anion gap: 10 (ref 5–15)
BUN: 10 mg/dL (ref 6–20)
CO2: 26 mmol/L (ref 22–32)
Calcium: 9.5 mg/dL (ref 8.9–10.3)
Chloride: 103 mmol/L (ref 98–111)
Creatinine, Ser: 0.82 mg/dL (ref 0.44–1.00)
GFR, Estimated: >60 mL/min (ref >60)
Glucose, Bld: 91 mg/dL (ref 70–99)
Potassium: 3.8 mmol/L (ref 3.5–5.1)
Sodium: 139 mmol/L (ref 135–145)
Total Bilirubin: 0.7 mg/dL (ref 0.3–1.2)
Total Protein: 8.7 g/dL — ABNORMAL HIGH (ref 6.5–8.1)

## 2021-06-15 LAB — LIPASE, BLOOD: Lipase: 28 U/L (ref 11–51)

## 2021-06-15 LAB — RAPID URINE DRUG SCREEN, HOSP PERFORMED
Amphetamines: POSITIVE — AB
Barbiturates: NOT DETECTED
Benzodiazepines: NOT DETECTED
Cocaine: NOT DETECTED
Opiates: NOT DETECTED
Tetrahydrocannabinol: POSITIVE — AB

## 2021-06-15 LAB — RESP PANEL BY RT-PCR (FLU A&B, COVID) ARPGX2
Influenza A by PCR: NEGATIVE
Influenza B by PCR: NEGATIVE
SARS Coronavirus 2 by RT PCR: NEGATIVE

## 2021-06-15 LAB — SALICYLATE LEVEL: Salicylate Lvl: <7 mg/dL — ABNORMAL LOW (ref 7.0–30.0)

## 2021-06-15 LAB — I-STAT BETA HCG BLOOD, ED (MC, WL, AP ONLY): I-stat hCG, quantitative: <5 m[IU]/mL (ref <5)

## 2021-06-15 LAB — ETHANOL: Alcohol, Ethyl (B): 17 mg/dL — ABNORMAL HIGH (ref <10)

## 2021-06-15 MED ORDER — LORAZEPAM 1 MG PO TABS: 0.0000 mg | ORAL_TABLET | Freq: Two times a day (BID) | ORAL | Status: DC

## 2021-06-15 MED ORDER — LORAZEPAM 1 MG PO TABS
0.0000 mg | ORAL_TABLET | Freq: Four times a day (QID) | ORAL | Status: DC
  Administered 2021-06-15: 1 mg via ORAL
  Administered 2021-06-15: 2 mg via ORAL
  Filled 2021-06-15: qty 1
  Filled 2021-06-15: qty 2

## 2021-06-15 MED ORDER — THIAMINE HCL 100 MG/ML IJ SOLN: 100.0000 mg | Freq: Every day | INTRAMUSCULAR | Status: DC

## 2021-06-15 MED ORDER — LORAZEPAM 2 MG/ML IJ SOLN: 0.0000 mg | Freq: Four times a day (QID) | INTRAMUSCULAR | Status: DC

## 2021-06-15 MED ORDER — THIAMINE HCL 100 MG PO TABS
100.0000 mg | ORAL_TABLET | Freq: Every day | ORAL | Status: DC
Start: 2021-06-15 — End: 2021-06-16
  Administered 2021-06-15: 100 mg via ORAL
  Filled 2021-06-15: qty 1

## 2021-06-15 MED ORDER — ONDANSETRON 8 MG PO TBDP
8.0000 mg | ORAL_TABLET | Freq: Once | ORAL | Status: AC
  Administered 2021-06-15: 8 mg via ORAL
  Filled 2021-06-15: qty 1

## 2021-06-15 MED ORDER — MELATONIN 3 MG PO TABS
3.0000 mg | ORAL_TABLET | Freq: Every day | ORAL | Status: DC
  Administered 2021-06-15: 3 mg via ORAL
  Filled 2021-06-15: qty 1

## 2021-06-15 MED ORDER — LORAZEPAM 2 MG/ML IJ SOLN: 0.0000 mg | Freq: Two times a day (BID) | INTRAMUSCULAR | Status: DC

## 2021-06-15 NOTE — BH Assessment (Addendum)
Per Roselyn Bering, NP patient meets criteria for inpatient psychiatric treatment. @ 2215, patient requested the [facility] to review patient for admission to [facility], adult unit.  ?

## 2021-06-15 NOTE — ED Notes (Signed)
Pt moved to TCU. Report given to [employer]. Belongings in cabinets in TCU 30.  ?

## 2021-06-15 NOTE — ED Provider Notes (Signed)
?[facility]-EMERGENCY DEPT ?Provider Note ? ? ?CSN: [id number] ?Arrival date & time: 06/15/21  1159 ? ?  ? ?History ? ?Chief Complaint  ?Patient presents with  ? Withdrawal  ? Leg Pain  ? Suicidal  ? ? ?Martha Mercado is a 26 y.o. female.  Martha Mercado is a 26 y.o. female.  Patient presents to the emergency department complaining of nausea and vomiting secondary to drug and alcohol use.  The patient states that over the past day she has consumed 2 bottles of liquor, cocaine, and taking ecstasy.  The patient also endorses generalized abdominal pain.  The patient complained of bilateral leg pain in triage but states that the pain is resolved at this time. PMH significant for methamphetamine abuse and adjustment disorder with mixed disturbance of emotions and conduct ? ?Patient requested to speak again and states that she has SI with plan, plans on intentional overdose. No HI, no hallucinations ? ?HPI ? ?  ? ?Home Medications ?Prior to Admission medications   ?Not on File  ?   ? ?Allergies    ?Patient has no known allergies.   ? ?Review of Systems   ?Review of Systems  ?Constitutional:  Negative for fever.  ?Eyes:  Negative for visual disturbance.  ?Respiratory:  Negative for shortness of breath.   ?Cardiovascular:  Negative for chest pain.  ?Gastrointestinal:  Positive for abdominal pain and nausea. Negative for vomiting.  ?Musculoskeletal: Negative.   ?Psychiatric/Behavioral:  Positive for suicidal ideas. Negative for hallucinations.   ? ?Physical Exam ?Updated Vital Signs ?BP 130/86   Pulse 98   Temp 98 ?F (36.7 ?C) (Oral)   Resp 20   SpO2 99%  ?Physical Exam ?Vitals and nursing note reviewed.  ?Constitutional:   ?   General: She is not in acute distress. ?HENT:  ?   Head: Normocephalic and atraumatic.  ?Eyes:  ?   Conjunctiva/sclera: Conjunctivae normal.  ?Cardiovascular:  ?   Rate and Rhythm: Normal rate and regular rhythm.  ?   Pulses: Normal pulses.  ?   Heart sounds: Normal heart  sounds.  ?Pulmonary:  ?   Effort: Pulmonary effort is normal.  ?   Breath sounds: Normal breath sounds.  ?Abdominal:  ?   Palpations: Abdomen is soft.  ?   Tenderness: There is no abdominal tenderness.  ?Musculoskeletal:  ?   Cervical back: Normal range of motion and neck supple.  ?Skin: ?   General: Skin is warm and dry.  ?Neurological:  ?   Mental Status: She is alert and oriented to person, place, and time.  ?Psychiatric:  ?   Comments: Patient complaining of suicidal thoughts and concerns of substance abuse  ? ? ?ED Results / Procedures / Treatments   ?Labs ?(all labs ordered are listed, but only abnormal results are displayed) ?Labs Reviewed  ?COMPREHENSIVE METABOLIC PANEL - Abnormal; Notable for the following components:  ?    Result Value  ? Total Protein 8.7 (*)   ? All other components within normal limits  ?ETHANOL - Abnormal; Notable for the following components:  ? Alcohol, Ethyl (B) 17 (*)   ? All other components within normal limits  ?SALICYLATE LEVEL - Abnormal; Notable for the following components:  ? Salicylate Lvl <7.0 (*)   ? All other components within normal limits  ?CBC WITH DIFFERENTIAL/PLATELET - Abnormal; Notable for the following components:  ? Hemoglobin 16.0 (*)   ? All other components within normal limits  ?RAPID URINE DRUG SCREEN,  HOSP PERFORMED - Abnormal; Notable for the following components:  ? Amphetamines POSITIVE (*)   ? Tetrahydrocannabinol POSITIVE (*)   ? All other components within normal limits  ?RESP PANEL BY RT-PCR (FLU A&B, COVID) ARPGX2  ?LIPASE, BLOOD  ?I-STAT BETA HCG BLOOD, ED ([facility], [facility], [facility] ONLY)  ? ? ?EKG ?None ? ?Radiology ?No results found. ? ?Procedures ?Procedures  ? ? ?Medications Ordered in ED ?Medications  ?LORazepam (ATIVAN) injection 0-4 mg (has no administration in time range)  ?  Or  ?LORazepam (ATIVAN) tablet 0-4 mg (has no administration in time range)  ?LORazepam (ATIVAN) injection 0-4 mg (has no administration in time range)  ?  Or  ?LORazepam (ATIVAN)  tablet 0-4 mg (has no administration in time range)  ?thiamine tablet 100 mg (has no administration in time range)  ?  Or  ?thiamine (B-1) injection 100 mg (has no administration in time range)  ?ondansetron (ZOFRAN-ODT) disintegrating tablet 8 mg (8 mg Oral Given 06/15/21 1246)  ? ? ?ED Course/ Medical Decision Making/ A&P ?Clinical Course as of 06/15/21 1606  ?Fri Jun 15, 2021  ?1235 Patient asked to see me. Now states she has suicidal ideations with plan involving intentional overdose. Denies HI, denies hallucinations [LM]  ?  ?Clinical Course User Index ?[LM] Darrick Grinder, PA-C  ? ?                        ?Medical Decision Making ?Amount and/or Complexity of Data Reviewed ?Labs: ordered. ? ?Risk ?OTC drugs. ?Prescription drug management. ? ? ?Patient presents with concerns of suicidal intent with plan. She also has concerns about substance abuse and wants help. ? ?The patient has been placing in a psychiatric hold. TTS consult placed. She is positive for THC and amphetamine. Ethanol 17.  ? ?The patient is clear at this time from a medical standpoint for TTS evaluation. CIWA protocol in place ? ?Final Clinical Impression(s) / ED Diagnoses ?Final diagnoses:  ?Suicidal ideations  ? ? ?Rx / DC Orders ?ED Discharge Orders   ? ? None  ? ?  ? ? ?  ?Darrick Grinder, PA-C ?06/15/21 1606 ? ?  ?Virgina Norfolk, DO ?06/15/21 1608 ? ?

## 2021-06-15 NOTE — ED Notes (Signed)
Pt belongings placed in 1 bag with phone and clothes in cabinets 23[address]25 and hall C. Pt wanded by security  ?

## 2021-06-15 NOTE — ED Triage Notes (Addendum)
BIBA ?Per EMS: Pt coming from hotel w/ c/o withdrawals. Pt refusing to speak to EMS and tell them what she is withdrawing from.  ?VSS ?134/84 ?90 HR ?100 RA  ?18 RR  ?Pt reports to this RN that she uses ecstasy, cocaine, and drinks, but states she has not been doing as much lately. Pt reports bilateral leg pain 10/10  ?
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
## 2021-06-15 NOTE — ED Provider Notes (Signed)
Patient has been evaluated by TTS, inpatient admission and psychiatric care recommended.  They also recommend IVC if the patient tries to leave.  Currently she is voluntary. ?  Rozelle Logan, DO ?06/15/21 2237 ? ?

## 2021-06-16 ENCOUNTER — Encounter (HOSPITAL_COMMUNITY): Payer: Self-pay | Admitting: Medical

## 2021-06-16 ENCOUNTER — Inpatient Hospital Stay (HOSPITAL_COMMUNITY)
Admission: AD | Admit: 2021-06-16 | Discharge: 2021-06-21 | DRG: 897 | Disposition: A | Discharge status: Home / Self Care | Payer: Medicaid Other | Source: Other Acute Inpatient Hospital | Attending: Psychiatry | Admitting: Psychiatry

## 2021-06-16 DIAGNOSIS — F4310 Post-traumatic stress disorder, unspecified: Secondary | ICD-10-CM | POA: Diagnosis not present

## 2021-06-16 DIAGNOSIS — F4010 Social phobia, unspecified: Secondary | ICD-10-CM | POA: Diagnosis present

## 2021-06-16 DIAGNOSIS — F1210 Cannabis abuse, uncomplicated: Secondary | ICD-10-CM | POA: Diagnosis not present

## 2021-06-16 DIAGNOSIS — F1010 Alcohol abuse, uncomplicated: Secondary | ICD-10-CM | POA: Diagnosis not present

## 2021-06-16 DIAGNOSIS — Z5900 Homelessness unspecified: Secondary | ICD-10-CM

## 2021-06-16 DIAGNOSIS — F17210 Nicotine dependence, cigarettes, uncomplicated: Secondary | ICD-10-CM | POA: Diagnosis present

## 2021-06-16 DIAGNOSIS — F1610 Hallucinogen abuse, uncomplicated: Secondary | ICD-10-CM | POA: Diagnosis present

## 2021-06-16 DIAGNOSIS — F1994 Other psychoactive substance use, unspecified with psychoactive substance-induced mood disorder: Secondary | ICD-10-CM | POA: Diagnosis not present

## 2021-06-16 DIAGNOSIS — R45851 Suicidal ideations: Secondary | ICD-10-CM | POA: Diagnosis present

## 2021-06-16 DIAGNOSIS — G4700 Insomnia, unspecified: Secondary | ICD-10-CM | POA: Diagnosis not present

## 2021-06-16 DIAGNOSIS — F1490 Cocaine use, unspecified, uncomplicated: Secondary | ICD-10-CM | POA: Diagnosis present

## 2021-06-16 DIAGNOSIS — F1290 Cannabis use, unspecified, uncomplicated: Secondary | ICD-10-CM | POA: Diagnosis present

## 2021-06-16 DIAGNOSIS — Z20822 Contact with and (suspected) exposure to covid-19: Secondary | ICD-10-CM | POA: Diagnosis present

## 2021-06-16 DIAGNOSIS — Z419 Encounter for procedure for purposes other than remedying health state, unspecified: Secondary | ICD-10-CM | POA: Diagnosis not present

## 2021-06-16 DIAGNOSIS — F1410 Cocaine abuse, uncomplicated: Secondary | ICD-10-CM | POA: Diagnosis not present

## 2021-06-16 MED ORDER — LORAZEPAM 1 MG PO TABS
1.0000 mg | ORAL_TABLET | Freq: Four times a day (QID) | ORAL | Status: AC
  Administered 2021-06-16 – 2021-06-17 (×5): 1 mg via ORAL
  Filled 2021-06-16 (×6): qty 1

## 2021-06-16 MED ORDER — LORAZEPAM 1 MG PO TABS
1.0000 mg | ORAL_TABLET | Freq: Two times a day (BID) | ORAL | Status: AC
  Administered 2021-06-18 – 2021-06-19 (×2): 1 mg via ORAL
  Filled 2021-06-16 (×2): qty 1

## 2021-06-16 MED ORDER — ONDANSETRON 4 MG PO TBDP
4.0000 mg | ORAL_TABLET | Freq: Four times a day (QID) | ORAL | Status: DC | PRN
  Administered 2021-06-16: 4 mg via ORAL
  Filled 2021-06-16: qty 1

## 2021-06-16 MED ORDER — LORAZEPAM 1 MG PO TABS
1.0000 mg | ORAL_TABLET | Freq: Every day | ORAL | Status: AC
  Administered 2021-06-20: 1 mg via ORAL
  Filled 2021-06-16: qty 1

## 2021-06-16 MED ORDER — HYDROXYZINE HCL 25 MG PO TABS
25.0000 mg | ORAL_TABLET | Freq: Four times a day (QID) | ORAL | Status: DC | PRN
Start: 2021-06-16 — End: 2021-06-16

## 2021-06-16 MED ORDER — OLANZAPINE 5 MG PO TABS
5.0000 mg | ORAL_TABLET | Freq: Every evening | ORAL | Status: DC | PRN
  Administered 2021-06-16: 5 mg via ORAL
  Filled 2021-06-16 (×5): qty 1

## 2021-06-16 MED ORDER — LOPERAMIDE HCL 2 MG PO CAPS: 2.0000 mg | ORAL_CAPSULE | ORAL | Status: DC | PRN

## 2021-06-16 MED ORDER — MAGNESIUM HYDROXIDE 400 MG/5ML PO SUSP
30.0000 mL | Freq: Every day | ORAL | Status: DC | PRN
Start: 2021-06-16 — End: 2021-06-21

## 2021-06-16 MED ORDER — THIAMINE HCL 100 MG PO TABS
100.0000 mg | ORAL_TABLET | Freq: Every day | ORAL | Status: DC
Start: 2021-06-17 — End: 2021-06-21
  Administered 2021-06-17 – 2021-06-21 (×5): 100 mg via ORAL
  Filled 2021-06-16 (×6): qty 1

## 2021-06-16 MED ORDER — ALUM & MAG HYDROXIDE-SIMETH 200-200-20 MG/5ML PO SUSP
30.0000 mL | ORAL | Status: DC | PRN
  Administered 2021-06-20: 30 mL via ORAL
  Filled 2021-06-16 (×2): qty 30

## 2021-06-16 MED ORDER — OLANZAPINE 5 MG PO TABS
5.0000 mg | ORAL_TABLET | Freq: Once | ORAL | Status: AC
  Administered 2021-06-16: 5 mg via ORAL
  Filled 2021-06-16: qty 1

## 2021-06-16 MED ORDER — LORAZEPAM 1 MG PO TABS: 1.0000 mg | ORAL_TABLET | ORAL | Status: DC | PRN

## 2021-06-16 MED ORDER — ACETAMINOPHEN 325 MG PO TABS: 650.0000 mg | ORAL_TABLET | Freq: Four times a day (QID) | ORAL | Status: DC | PRN

## 2021-06-16 MED ORDER — LORAZEPAM 2 MG/ML IJ SOLN: 1.0000 mg | INTRAMUSCULAR | Status: DC | PRN

## 2021-06-16 MED ORDER — THIAMINE HCL 100 MG PO TABS
100.0000 mg | ORAL_TABLET | Freq: Every day | ORAL | Status: DC
  Filled 2021-06-16: qty 1

## 2021-06-16 MED ORDER — LORAZEPAM 1 MG PO TABS
1.0000 mg | ORAL_TABLET | Freq: Three times a day (TID) | ORAL | Status: AC
  Administered 2021-06-18 (×2): 1 mg via ORAL
  Filled 2021-06-16 (×3): qty 1

## 2021-06-16 MED ORDER — FOLIC ACID 1 MG PO TABS
1.0000 mg | ORAL_TABLET | Freq: Every day | ORAL | Status: DC
  Administered 2021-06-16 – 2021-06-21 (×6): 1 mg via ORAL
  Filled 2021-06-16 (×8): qty 1

## 2021-06-16 MED ORDER — THIAMINE HCL 100 MG/ML IJ SOLN
100.0000 mg | Freq: Every day | INTRAMUSCULAR | Status: DC
Start: 2021-06-16 — End: 2021-06-16

## 2021-06-16 MED ORDER — HYDROXYZINE HCL 25 MG PO TABS: 25.0000 mg | ORAL_TABLET | Freq: Four times a day (QID) | ORAL | Status: AC | PRN

## 2021-06-16 MED ORDER — ADULT MULTIVITAMIN W/MINERALS CH
1.0000 | ORAL_TABLET | Freq: Every day | ORAL | Status: DC
  Filled 2021-06-16: qty 1

## 2021-06-16 MED ORDER — LORAZEPAM 1 MG PO TABS
1.0000 mg | ORAL_TABLET | Freq: Four times a day (QID) | ORAL | Status: DC | PRN
Start: 2021-06-16 — End: 2021-06-19

## 2021-06-16 MED ORDER — ADULT MULTIVITAMIN W/MINERALS CH
1.0000 | ORAL_TABLET | Freq: Every day | ORAL | Status: DC
Start: 2021-06-16 — End: 2021-06-21
  Administered 2021-06-16 – 2021-06-21 (×6): 1 via ORAL
  Filled 2021-06-16 (×9): qty 1

## 2021-06-16 NOTE — Progress Notes (Signed)
Nursing 1:1 ? ?D.  Pt resting in bed with eyes closed after receiving sleep medications.  Pt complained of acid reflex earlier and received medication for this.  No acute distress noted.   ? ?A.  Pt given medications as ordered as well as support and encouragement,  Pt also given fluids and snacks.   ? ?R.  Will continue to monitor, 1:1 continued while awake as ordered ?

## 2021-06-16 NOTE — ED Notes (Signed)
Opal Sidles, RN at [facility] called and stated that are ready to receive this patient. Called [facility] her [facility]. She stated they will be her in 2 minutes ?

## 2021-06-16 NOTE — [facility]HH Suicide Risk Assessment (Signed)
Suicide Risk Assessment ? ?Admission Assessment    ?[facility] Admission Suicide Risk Assessment ? ? ?Nursing information obtained from:  Patient ?Demographic factors:  Adolescent or young adult ?Current Mental Status:  Suicidal ideation indicated by patient ?Loss Factors:  Financial problems / change in socioeconomic status ?Historical Factors:  Prior suicide attempts ?Risk Reduction Factors:  NA ? ?Total Time spent with patient: 1.5 hours ?Principal Problem: Suicidal ideations ?Diagnosis:  Principal Problem: ?  Suicidal ideations ? ?Subjective Data: Martha Mercado is a 26 yo patient w/ PPH of Polysubstance abuse who transferred from [facility] after presenting endorsing N/V after use of Etoh and substances she also endorsed SI on assessment. Patient was transferred to [facility] after being declared medical stable. ? On assessment today patient is Aox4. Patient reports she went to the ED because she was not feeling well and thought it may be the drugs.  Patient reports " I will kill myself when I get the first opportunity." Patient reports that she has to drink EtOH in order to sleep and if she does not drink she will wake up every 2-3 hrs. Patient endorses anhedonia, decreased energy, decreased concentration and SI daily. Patient also endorses significant feelings of hopelessness, worthlessness, and guilt. Patient reports :there is no reason for me to be on this Earth." Patient reports "if I wrote out a list of reason to die and reasons to live the the reasons to die are longer." Patient reports that she believes she is a terrible person. " I'm no good, I'm worthless, I'm not a good person when I drink and I'm not a good person. 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 Patient endorses she has not done heroin before, but this is the way she has chosen to kill herself. When asked if provider will see patient tom, she does not answer and instead looks around the room (presumably for anyway she could harm herself) " I want to make sure I give an accurate answer." Patient denies having any ideas in the hospital but does not contract for safety. Patient denies HI and AVH. ? ?Continued Clinical Symptoms:  ?Alcohol Use Disorder Identification Test Final Score (AUDIT): 21 ?The "Alcohol Use Disorders Identification Test", Guidelines for Use in Primary Care, Second Edition.  World Science writer [facility]). ?Score between 0-7:  no or low risk or alcohol related problems. ?Score between 8-15:  moderate risk of alcohol related problems. ?Score between 16-19:  high risk of alcohol related problems. ?Score 20 or above:  warrants further diagnostic evaluation for alcohol dependence and treatment. ? ? ?CLINICAL FACTORS:  ? Depression:   Anhedonia ?Severe ?Alcohol/Substance Abuse/Dependencies ? ? ?Musculoskeletal: ?Strength & Muscle Tone: within normal limits ?Gait & Station: normal ?Patient leans: N/A ? ?Psychiatric Specialty Exam: ? ?Presentation  ?General Appearance: Appropriate for Environment; Casual ? ?Eye Contact:Fair ? ?Speech:Clear and Coherent ? ?Speech Volume:Normal ? ?Handedness:No data recorded ? ?Mood and Affect  ?Mood:Depressed; Dysphoric ? ?Affect:Flat ? ? ?Thought Process  ?Thought Processes:Linear ? ?Descriptions of Associations:Circumstantial ? ?Orientation:Full (Time, Place and Person) ? ?Thought Content:Logical ? ?History of Schizophrenia/Schizoaffective disorder:No ? ?Duration of Psychotic Symptoms:No data recorded ?Hallucinations:Hallucinations: None ? ?Ideas of Reference:None ? ?Suicidal Thoughts:Suicidal Thoughts: Yes, Active ?SI  Active Intent and/or Plan: With Plan; Without Means to Carry Out ? ?Homicidal Thoughts:Homicidal Thoughts: No ? ? ?Sensorium   ?Memory:Immediate Good; Recent Good ? ?Judgment:Impaired ? ?Insight:Shallow ? ? ?Executive Functions  ?Concentration:Fair ? ?Attention Span:Fair ? ?Recall:No data recorded ?Fund of Knowledge:Fair ? ?Language:Fair ? ? ?Psychomotor Activity  ?Psychomotor Activity:Psychomotor Activity: Normal ? ? ?Assets  ?Assets:Communication Skills; Resilience ? ? ?Sleep  ?Sleep:Sleep: Poor ? ? ? ?Physical Exam: ?Physical Exam ?HENT:  ?   Head: Normocephalic and atraumatic.  ?Pulmonary:  ?   Effort: Pulmonary effort is normal.  ?Neurological:  ?   Mental Status: She is alert and oriented to person, place, and time.  ? ?Review of Systems  ?Psychiatric/Behavioral:  Positive for suicidal ideas. Negative for hallucinations.   ?Blood pressure 123/77, pulse 83, temperature 97.7 ?F (36.5 ?C), temperature source Oral, resp. rate 12, height 5\' 8"  (1.727 m), weight 70.3 kg, SpO2 99 %. Body mass index is 23.57 kg/m?. ? ? ?COGNITIVE FEATURES THAT CONTRIBUTE TO RISK:  ?Thought constriction (tunnel vision)   ? ?SUICIDE RISK:  ? Extreme:  Frequent, intense, and enduring suicidal ideation, specific plans, clear subjective and objective intent, impaired self-control, severe dysphoria/symptomatology, many risk factors and no protective factors. ? ?PLAN OF CARE: Admit due to SI and severe polysubstance abuse. She needs crisis stabilization, safety monitoring and medication management.  ?   ? ?I certify that inpatient services furnished can reasonably be expected to improve the patient's condition.  ? ? , MD ?06/16/2021, 5:08 PM ?
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
## 2021-06-16 NOTE — Progress Notes (Signed)
?   06/16/21 2317  ?Psych Admission Type (Psych Patients Only)  ?Admission Status Voluntary  ?Psychosocial Assessment  ?Patient Complaints Anxiety;Insomnia  ?Eye Contact Brief  ?Facial Expression Flat  ?Affect Appropriate to circumstance  ?Speech Soft  ?Interaction Minimal  ?Motor Activity Other (Comment) ?(WDL)  ?Appearance/Hygiene Disheveled  ?Behavior Characteristics Appropriate to situation  ?Mood Depressed  ?Thought Process  ?Coherency WDL  ?Content WDL  ?Delusions None reported or observed  ?Perception WDL  ?Hallucination None reported or observed  ?Judgment Impaired  ?Confusion None  ?Danger to Self  ?Current suicidal ideation? Denies  ?Danger to Others  ?Danger to Others None reported or observed  ? ? ?
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
## 2021-06-16 NOTE — Progress Notes (Signed)
1:1 Nursing note ? ? ?Patient awake and alert sitting up in bed eating dinner with MHT present at bedside. Pt voices no complaints. No behavioral issues noted. Pt remains safe with Q 15 min checks and 1:1 observation. ?

## 2021-06-16 NOTE — Progress Notes (Addendum)
D. Pt presents with a depressed affect/ mood- expressing feelings of hopelessness, and endorses SI and unable to verbally contract for safety at this time. Pt placed on 1:1 observation per MD order.  ?A. Labs and vitals monitored. Pt given and educated on medications. Pt supported emotionally and encouraged to express concerns and ask questions.   ?R. Pt remains safe with 15 minute checks. Will continue POC. ? ?  ?

## 2021-06-16 NOTE — BHH Suicide Risk Assessment (Signed)
BHH INPATIENT:  Family/Significant Other Suicide Prevention Education ? ?Suicide Prevention Education:  ?Patient Refusal for Family/Significant Other Suicide Prevention Education: The patient Martha Mercado has refused to provide written consent for family/significant other to be provided Family/Significant Other Suicide Prevention Education during admission and/or prior to discharge.  Physician notified. ? ?Lynnell Chad ?06/16/2021, 2:44 PM ?

## 2021-06-16 NOTE — BHH Group Notes (Signed)
Group Date: 06/16/2021 ?Start Time: 10:00am ?End Time: 11:00am ?  ?  ?Type of Therapy and Topic:  Group Therapy: Anger ?  ?Participation Level:  Active ?  ?Description of Group:   Due to the illness on the unit, Infectious Disease has recommended no close contact at this time so group was not held.  Patient was provided with written information and worksheets about anger.  CSW could not go over information individually with the patient, as she was sleeping. ?  ?  ?  ?Ambrose Mantle, LCSW ?06/16/2021 ?

## 2021-06-16 NOTE — ED Notes (Addendum)
Gave report to Erskine Squibb, Charity fundraiser, at [facility]. Left number in case she had additional questions.  ? ?Print production planner to transport patient to [facility].  ? ?RN called back to say the [facility] at [facility] wants [place] to wait until they get the 2 patients checked in that have just arrived. Called Safe Transport to let then know not to come now to pick up patient and we will call when we are ready.  ?

## 2021-06-16 NOTE — Tx Team (Signed)
Initial Treatment Plan ?06/16/2021 ?6:07 AM ?Martha Mercado ?[id number] ? ? ? ?PATIENT STRESSORS: ?Financial difficulties   ?Substance abuse   ? ? ?PATIENT STRENGTHS: ?General fund of knowledge  ?Physical Health  ? ? ?PATIENT IDENTIFIED PROBLEMS: ?Depression  ?Suicidal ideation  ?  ?"Pretty sure I'm going to kill myself when I leave"  ?  ?  ?  ?  ?  ?  ? ?DISCHARGE CRITERIA:  ?Improved stabilization in mood, thinking, and/or behavior ?Need for constant or close observation no longer present ?Reduction of life-threatening or endangering symptoms to within safe limits ?Verbal commitment to aftercare and medication compliance ? ?PRELIMINARY DISCHARGE PLAN: ?Outpatient therapy ?Medication management ? ?PATIENT/FAMILY INVOLVEMENT: ?This treatment plan has been presented to and reviewed with the patient, Martha Mercado.  The patient and family have been given the opportunity to ask questions and make suggestions. ? ?Levin Bacon, RN ?06/16/2021, 6:07 AM ?

## 2021-06-17 MED ORDER — SERTRALINE HCL 100 MG PO TABS
100.0000 mg | ORAL_TABLET | Freq: Every day | ORAL | Status: DC
  Administered 2021-06-17 – 2021-06-21 (×5): 100 mg via ORAL
  Filled 2021-06-17 (×7): qty 1

## 2021-06-17 MED ORDER — OLANZAPINE 2.5 MG PO TABS
2.5000 mg | ORAL_TABLET | Freq: Every evening | ORAL | Status: DC | PRN
  Filled 2021-06-17 (×4): qty 1

## 2021-06-17 NOTE — Progress Notes (Signed)
Nursing 1:1 note ? ?D.  Pt resting in bed with eyes closed, respirations even and unlabored ? ?A.  1:1 continued as ordered for Pt safety ? ?R.  Pt remains safe on the unit ?

## 2021-06-17 NOTE — Progress Notes (Signed)
[facility] MD Progress Note ? ?06/17/2021 4:25 PM ?Martha Mercado  ?MRN:  [id number] ?Subjective:  Martha SimmondsHannah Mercado is a 26 yo patient w/ PPH of Polysubstance abuse who [facility] after presenting endorsing N/V after use of Etoh and substances she also endorsed SI on assessment. [facility] after being declared medical stable. ?  ? ?Case was discussed in the multidisciplinary team. MAR was reviewed and patient was compliant with medications.  She did not require any PRN's for agitation. ?  ?  ?Psychiatric Team made the following recommendations yesterday:  ? ?-Recommended Zoloft ?- Started Zyprexa 5 mg nightly ? ?On assessment today patient reports that she slept very well.  Patient reports that she is eating "okay."  Patient reports that she is interested in rehab, "I should try before in my life."  Patient reports that she is "considering" living.  Patient reports it is difficult to think about why she should live because ultimately she does not want to.  Patient reports that she feels life is too expensive.  Patient reports that she has thought about what she wants if expense and other obstacles did not matter.  Patient reports "what I want is impossible."  Patient reports "I want to feel like when I was a kid with my sisters watching TV in the living room."  Patient endorses that she felt safe and hopeful during this time and that this is the feeling that she is missing and she is seeking.  Patient denies HI and AVH.  Patient reports that she still has SI with the same plan of using heroin to overdose but can contract for safety while she is in the hospital. ? ?Patient continues to look very depressed and is eating again during assessment.  Patient eats as if she has no cares and is not very future oriented. ?Principal Problem: Suicidal ideations ?Diagnosis: Principal Problem: ?  Suicidal ideations ? ?Total Time spent with patient: 20 minutes ? ?Past Psychiatric History: See H&P ? ?Past  Medical History:  ?Past Medical History:  ?Diagnosis Date  ? Dysmenorrhea   ? History reviewed. No pertinent surgical history. ?Family History:  ?Family History  ?Problem Relation Age of Onset  ? Hypertension Maternal Grandmother   ? Hypertension Maternal Grandfather   ? Diabetes Paternal Grandfather   ? Heart disease Paternal Grandfather   ? ?Family Psychiatric  History: See H&P ?Social History:  ?Social History  ? ?Substance and Sexual Activity  ?Alcohol Use Not Currently  ? Alcohol/week: 0.0 standard drinks  ?   ?Social History  ? ?Substance and Sexual Activity  ?Drug Use Yes  ? Types: Cocaine, Marijuana, Methamphetamines  ? Comment: last used meth and cocaine 2 years ago, still uses marijuana   ?  ?Social History  ? ?Socioeconomic History  ? Marital status: Single  ?  Spouse name: Not on file  ? Number of children: Not on file  ? Years of education: Not on file  ? Highest education level: Not on file  ?Occupational History  ? Not on file  ?Tobacco Use  ? Smoking status: Some Days  ?  Years: 3.00  ?  Types: Cigarettes  ? Smokeless tobacco: Never  ?Vaping Use  ? Vaping Use: Former  ?Substance and Sexual Activity  ? Alcohol use: Not Currently  ?  Alcohol/week: 0.0 standard drinks  ? Drug use: Yes  ?  Types: Cocaine, Marijuana, Methamphetamines  ?  Comment: last used meth and cocaine 2 years ago, still uses  marijuana   ? Sexual activity: Yes  ?  Birth control/protection: None  ?Other Topics Concern  ? Not on file  ?Social History Narrative  ? Not on file  ? ?Social Determinants of Health  ? ?Financial Resource Strain: Not on file  ?Food Insecurity: Not on file  ?Transportation Needs: Not on file  ?Physical Activity: Not on file  ?Stress: Not on file  ?Social Connections: Not on file  ? ?Additional Social History:  ?  ?  ?  ?  ?  ?  ?  ?  ?  ?  ?  ? ?Sleep: Good ? ?Appetite:  Good ? ?Current Medications: ?Current Facility-Administered Medications  ?Medication Dose Route Frequency Provider Last Rate Last Admin  ?  acetaminophen (TYLENOL) tablet 650 mg  650 mg Oral Q6H PRN Court Joy, PA-C      ? alum & mag hydroxide-simeth (MAALOX/MYLANTA) 200-200-20 MG/5ML suspension 30 mL  30 mL Oral Q4H PRN Court Joy, PA-C      ? folic acid (FOLVITE) tablet 1 mg  1 mg Oral Daily Court Joy, PA-C   1 mg at 06/17/21 1316  ? hydrOXYzine (ATARAX) tablet 25 mg  25 mg Oral Q6H PRN Bobbitt, Shalon E, NP      ? loperamide (IMODIUM) capsule 2-4 mg  2-4 mg Oral PRN Bobbitt, Franchot Mimes, NP      ? LORazepam (ATIVAN) tablet 1 mg  1 mg Oral Q6H PRN Bobbitt, Shalon E, NP      ? LORazepam (ATIVAN) tablet 1 mg  1 mg Oral QID Bobbitt, Shalon E, NP   1 mg at 06/17/21 1314  ? Followed by  ? LORazepam (ATIVAN) tablet 1 mg  1 mg Oral TID Bobbitt, Shalon E, NP      ? Followed by  ? [START ON 06/18/2021] LORazepam (ATIVAN) tablet 1 mg  1 mg Oral BID Bobbitt, Shalon E, NP      ? Followed by  ? [START ON 06/20/2021] LORazepam (ATIVAN) tablet 1 mg  1 mg Oral Daily Bobbitt, Shalon E, NP      ? magnesium hydroxide (MILK OF MAGNESIA) suspension 30 mL  30 mL Oral Daily PRN Court Joy, PA-C      ? multivitamin with minerals tablet 1 tablet  1 tablet Oral Daily Bobbitt, Shalon E, NP   1 tablet at 06/17/21 1316  ? OLANZapine (ZYPREXA) tablet 5 mg  5 mg Oral QHS,MR X 1 Mariel Craft, MD   5 mg at 06/16/21 2112  ? ondansetron (ZOFRAN-ODT) disintegrating tablet 4 mg  4 mg Oral Q6H PRN Bobbitt, Shalon E, NP   4 mg at 06/16/21 1338  ? sertraline (ZOLOFT) tablet 100 mg  100 mg Oral Daily Eliseo Gum B, MD      ? thiamine tablet 100 mg  100 mg Oral Daily Bobbitt, Shalon E, NP   100 mg at 06/17/21 1316  ? ? ?Lab Results: No results found for this or any previous visit (from the past 48 hour(s)). ? ?Blood Alcohol level:  ?Lab Results  ?Component Value Date  ? ETH 17 (H) 06/15/2021  ? ETH <10 05/15/2020  ? ? ?Metabolic Disorder Labs: ?No results found for: HGBA1C, MPG ?No results found for: PROLACTIN ?No results found for: CHOL, TRIG, HDL, CHOLHDL, VLDL,  LDLCALC ? ?Physical Findings: ?AIMS: Facial and Oral Movements ?Muscles of Facial Expression: None, normal ?Lips and Perioral Area: None, normal ?Jaw: None, normal ?Tongue: None, normal,Extremity Movements ?Upper (arms, wrists, hands,  fingers): None, normal ?Lower (legs, knees, ankles, toes): None, normal, Trunk Movements ?Neck, shoulders, hips: None, normal, Overall Severity ?Severity of abnormal movements (highest score from questions above): None, normal ?Incapacitation due to abnormal movements: None, normal ?Patient's awareness of abnormal movements (rate only patient's report): No Awareness, Dental Status ?Current problems with teeth and/or dentures?: No ?Does patient usually wear dentures?: No  ?CIWA:  CIWA-Ar Total: 0 ?COWS:    ? ?Musculoskeletal: ?Strength & Muscle Tone: within normal limits ?Gait & Station: normal ?Patient leans: N/A ? ?Psychiatric Specialty Exam: ? ?Presentation  ?General Appearance: Appropriate for Environment; Casual ? ?Eye Contact:Minimal ? ?Speech:Clear and Coherent ? ?Speech Volume:Normal ? ?Handedness:No data recorded ? ?Mood and Affect  ?Mood:Depressed ? ?Affect:Flat ? ? ?Thought Process  ?Thought Processes:Linear ? ?Descriptions of Associations:Circumstantial ? ?Orientation:Full (Time, Place and Person) ? ?Thought Content:Logical ? ?History of Schizophrenia/Schizoaffective disorder:No ? ?Duration of Psychotic Symptoms:No data recorded ?Hallucinations:Hallucinations: None ? ?Ideas of Reference:None ? ?Suicidal Thoughts:Suicidal Thoughts: Yes, Active (Can contract for safety while in the hospital) ?SI Active Intent and/or Plan: Without Means to Carry Out ? ?Homicidal Thoughts:Homicidal Thoughts: No ? ? ?Sensorium  ?Memory:Immediate Good; Recent Good ? ?Judgment:-- (Improving) ? ?Insight:Shallow ? ? ?Executive Functions  ?Concentration:Fair ? ?Attention Span:Fair ? ?Recall:No data recorded ?Fund of Knowledge:Fair ? ?Language:Fair ? ? ?Psychomotor Activity  ?Psychomotor  Activity:Psychomotor Activity: Psychomotor Retardation ? ? ?Assets  ?Assets:Resilience ? ? ?Sleep  ?Sleep:Sleep: Good ? ? ? ?Physical Exam: ?Physical Exam ?HENT:  ?   Head: Normocephalic and atraumatic.  ?Pulmonary:  ?   Effort

## 2021-06-17 NOTE — Progress Notes (Signed)
?   06/17/21 2120  ?Psych Admission Type (Psych Patients Only)  ?Admission Status Voluntary  ?Psychosocial Assessment  ?Patient Complaints Anxiety;Depression;Hopelessness  ?Eye Contact Brief  ?Facial Expression Sad  ?Affect Depressed;Sad  ?Speech Soft;Slow  ?Interaction Minimal;Isolative  ?Motor Activity Slow  ?Appearance/Hygiene Unremarkable  ?Behavior Characteristics Unwilling to participate;Irritable  ?Mood Depressed;Helpless;Irritable  ?Thought Process  ?Coherency WDL  ?Content Blaming self  ?Delusions None reported or observed  ?Perception WDL  ?Hallucination None reported or observed  ?Judgment Poor  ?Confusion None  ?Danger to Self  ?Current suicidal ideation? Passive  ?Self-Injurious Behavior Some self-injurious ideation observed or expressed.  No lethal plan expressed   ?Agreement Not to Harm Self Yes  ?Description of Agreement Verbal agreement to not harm self  ?Danger to Others  ?Danger to Others None reported or observed  ? ? ?

## 2021-06-17 NOTE — Progress Notes (Signed)
D. Pt continues to present as very depressed- has slept for most of the day, but has been eating and drinking. Pt continues to endorse passive SI, but verbally contracts for safety- agreeing to contact staff before acting on any harmful thoughts. Pt denies A/VH, and does not appear to be responding to internal stimuli. A. Labs and vitals monitored. Pt given and educated on medications. Pt supported emotionally and encouraged to express concerns and ask questions.   ?R. Pt remains safe with 15 minute checks. Will continue POC. ? ?  ?

## 2021-06-17 NOTE — Progress Notes (Signed)
1:1 D/C note: ?D:  Martha Mercado is resting with her eyes closed and appears to be asleep. ?A:  15 minute checks maintained for safety. ?R:  Adrianah remains safe on the unit. ?

## 2021-06-17 NOTE — Progress Notes (Signed)
Nursing Note :1:1 ? ?D.  Pt resting in bed with eyes closed, respirations even and unlabored.  No distress noted ? ?A.  1:1 continued as ordered for Pt safety ? ?R.  Pt remains safe on the unit. ?

## 2021-06-17 NOTE — Progress Notes (Signed)
1:1 d/c note: ?Emerald briefly came out of her room this evening.  She denied SI/HI or AVH.  She verbally agreed to seek out staff if she had thoughts to harm herself.  This writer went in to her room to offer snack and inform her that she needed to come to the medication window for medications.  She stated "ok."  She did not come out of her room.  Informed her again that she needed to get her medications and at that time she would not acknowledge staff.  She would not answer questions and did not get out of bed.  She is currently resting with eyes closed and appears to be sleeping.  Breathing even and unlabored.  Q 15 minute checks maintained for safety.  She remains safe on the unit.  Will continue to offer medications. ?

## 2021-06-17 NOTE — Progress Notes (Signed)
1:1 d/c note: ?Omaya agreed to contract for safety and seek out staff if that changes.  She is laying quietly in her bed.   She does not appear to be in any physical distress.  Q 15 minute checks maintained for safety.  She remains safe on the unit. ?

## 2021-06-17 NOTE — Group Note (Signed)
LCSW Group Therapy ? ? ?CSW group not facilitated due to unit limitations on patient proximity/interactions due to infection prevention measures. ? ?Aldine Contes LCSWA  ?10:35 AM  ? ?

## 2021-06-17 NOTE — Progress Notes (Signed)
1:1 Nursing Note ? ?D:: Patient resting in bed with eyes closed- appears to be sleeping. Respirations even and unlabored.  ? ?A. 1:1 while awake per pt's safety ? ?R: Pt remains safe on the unit ?

## 2021-06-18 ENCOUNTER — Encounter (HOSPITAL_COMMUNITY): Payer: Self-pay

## 2021-06-18 DIAGNOSIS — F1994 Other psychoactive substance use, unspecified with psychoactive substance-induced mood disorder: Principal | ICD-10-CM | POA: Diagnosis present

## 2021-06-18 DIAGNOSIS — F1610 Hallucinogen abuse, uncomplicated: Secondary | ICD-10-CM | POA: Diagnosis present

## 2021-06-18 DIAGNOSIS — F1210 Cannabis abuse, uncomplicated: Secondary | ICD-10-CM | POA: Diagnosis present

## 2021-06-18 DIAGNOSIS — F1410 Cocaine abuse, uncomplicated: Secondary | ICD-10-CM | POA: Diagnosis present

## 2021-06-18 DIAGNOSIS — F4310 Post-traumatic stress disorder, unspecified: Secondary | ICD-10-CM | POA: Diagnosis present

## 2021-06-18 DIAGNOSIS — R45851 Suicidal ideations: Secondary | ICD-10-CM

## 2021-06-18 DIAGNOSIS — F1010 Alcohol abuse, uncomplicated: Secondary | ICD-10-CM | POA: Diagnosis present

## 2021-06-18 DIAGNOSIS — F4010 Social phobia, unspecified: Secondary | ICD-10-CM | POA: Diagnosis present

## 2021-06-18 LAB — HIV ANTIBODY (ROUTINE TESTING W REFLEX): HIV Screen 4th Generation wRfx: NONREACTIVE

## 2021-06-18 LAB — TSH: TSH: 0.798 u[IU]/mL (ref 0.350–4.500)

## 2021-06-18 MED ORDER — WHITE PETROLATUM EX OINT
TOPICAL_OINTMENT | CUTANEOUS | Status: AC
  Administered 2021-06-18: 1
  Filled 2021-06-18: qty 5

## 2021-06-18 MED ORDER — TRAZODONE HCL 50 MG PO TABS
50.0000 mg | ORAL_TABLET | Freq: Once | ORAL | Status: AC
  Administered 2021-06-18: 50 mg via ORAL
  Filled 2021-06-18 (×2): qty 1

## 2021-06-18 MED ORDER — OLANZAPINE 2.5 MG PO TABS: 2.5000 mg | ORAL_TABLET | Freq: Every day | ORAL | Status: DC

## 2021-06-18 NOTE — Progress Notes (Signed)
[facility] Group Notes:  (Nursing/MHT/Case Management/Adjunct) ? ?Date:  06/18/2021  ?Time:  2000 ? ?Type of Therapy:   wrap up group ? ?Participation Level:  Active ? ?Participation Quality:  Appropriate, Attentive, Sharing, and Supportive ? ?Affect:  Anxious and Appropriate ? ?Cognitive:  Alert ? ?Insight:  Improving ? ?Engagement in Group:  Engaged ? ?Modes of Intervention:  Clarification, Education, and Support ? ?Summary of Progress/Problems: Positive thinking and positive change were discussed.  ? ?Winfield Rast S ?06/18/2021, 9:12 PM ?

## 2021-06-18 NOTE — Progress Notes (Signed)
1:1 D/C note: ?D:  Martha Mercado continues to be resting with her eyes closed and appears to be asleep.  Breathing even and unlabored.  She appears to be in no physical distress. ?A:  Q 15 minute checks maintained for safety. ?R:  She remains safe on the unit. ?

## 2021-06-18 NOTE — Group Note (Signed)
Recreation Therapy Group Note ? ? ?Group Topic:Self-Esteem  ?Group Date: 06/18/2021 ?Start Time: 0930 ?End Time: 1000 ?Facilitators: Caroll Rancher, LRT,CTRS ?Location: [address] Day[address] ? ? ?Goal Area(s) Addresses:  ?Patient will successfully identify positive attributes about themselves.  ?Patient will identify healthy ways to increase self-esteem. ?Patient will acknowledge benefit(s) of improved self-esteem.  ? ?Group Description: Patients were given a packet that focused on self-esteem. The packet consisted of positive steps to wellbeing, identifying accomplishments/things they have overcome/what they do for self care and goals for the future.  The last sheet focused on identifying what they do for self care.  ? ? ?Participation Quality: Independent ?  ? ?Clinical Observations/Individualized Feedback: Due to patients still being unable to leave rooms, packets were given for patients to complete at their own pace that focused on self-esteem and self care.  ?  ? ?Plan: Continue to engage patient in RT group sessions 2-3x/week. ? ? ?Caroll Rancher, LRT,CTRS ?06/18/2021 12:43 PM ?

## 2021-06-18 NOTE — Group Note (Signed)
Type of Therapy and Topic:  Group Therapy: Circle of Control ?  ?Participation Level:  Active ?  ?Description of Group:   Due to the acuity on the unit, staff recommended no close contact at this time so group was not held.  Patient was provided with written information and worksheets about circle of control.  CSW could not go over information with the patient, as she was on the phone. ? ? ?Taelyn Nemes, LCSW, LCAS ?Clincal Social Worker  ?[facility] ? ?

## 2021-06-18 NOTE — Progress Notes (Signed)
? ? ? ?   06/18/21 2126  ?Psych Admission Type (Psych Patients Only)  ?Admission Status Voluntary  ?Psychosocial Assessment  ?Patient Complaints Anxiety;Depression  ?Eye Contact Brief  ?Facial Expression Sad  ?Affect Depressed  ?Speech Soft;Slow  ?Interaction Minimal  ?Motor Activity Slow  ?Appearance/Hygiene Unremarkable  ?Thought Process  ?Coherency WDL  ?Content WDL  ?Delusions None reported or observed  ?Perception WDL  ?Hallucination None reported or observed  ?Judgment Poor  ?Confusion None  ?Danger to Self  ?Current suicidal ideation? Denies  ?Agreement Not to Harm Self Yes  ?Description of Agreement Verbal contract for safety  ?Danger to Others  ?Danger to Others None reported or observed  ? ? ?

## 2021-06-18 NOTE — Progress Notes (Addendum)
[facility] MD Progress Note ? ?06/18/2021 5:09 PM ?Martha Mercado  ?MRN:  [id number] ?Subjective:  Martha SimmondsHannah Mercado is a 26 yo patient w/ PPH of Polysubstance abuse [facility] endorsing N/V after use of Etoh and substances she also endorsed SI on assessment. [facility] medical [facility]. ?  ?  ?Case was discussed in the multidisciplinary team. MAR was reviewed and patient was compliant with medications.  She did not require any PRN's for agitation. ?  ?  ?Psychiatric Team made the following recommendations yesterday:  ? ?-Continuous observation ?- Start Zoloft 100 mg daily ?- Decrease Zyprexa to 2.5 mg nightly, oversedated on 5 mg ? ?Assessment today patient reports that she slept very well and is eating well.  Patient reports that she continues to have SI but is able to contract while in the hospital.  Patient reports that her plan remains to kill herself by overdosing on heroin if she were released from the hospital.  She questioned where she could get clean needles. Provider asked patient multiple times why she would need this information and eventually discloses "so I can do it healthier."  Provider does not answer patient's questions about access to needles.  Patient reports she is still willing to go to rehab after discharge.  Patient and provider discussed patient's feelings about life.  Patient endorses that she does not feel life is worth living and endorses feeling overwhelmed by things going on in her life.  Provider tasked patient with the assignment of writing out at least 5 things that are stressing patient out.  Patient and provider discussed that it took at least 6 years for patient to reach this point and she must be able to give herself some time to fix the things that are bothering her.  Patient endorsed that she would at least attempt the assignment.  Patient denies HI and AVH. She denies paranoia, ideas of reference or first rank  symptoms. She reports oversedation but stable appetite. She voices no physical complaints.Patient continues to lie in bed and is isolative to her room.  Patient does interact on assessment but overall just appears very flat with psychomotor retardation. She denies cravings or signs of withdrawal from substances. ? ?Principal Problem: Substance induced mood disorder (HCC) ?Diagnosis: Principal Problem: ?  Substance induced mood disorder (HCC) ?Active Problems: ?  Suicidal ideations ?  Alcohol abuse ?  Hallucinogen abuse (HCC) ?  Cannabis abuse ?  Cocaine abuse (HCC) ?  PTSD (post-traumatic stress disorder) ?  Social anxiety disorder ? ?Total Time Spent in Direct Patient Care:  ?I personally spent 20 minutes on the unit in direct patient care. The direct patient care time included face-to-face time with the patient, reviewing the patient's chart, communicating with other professionals, and coordinating care. Greater than 50% of this time was spent in counseling or coordinating care with the patient regarding goals of hospitalization, psycho-education, and discharge planning needs. ? ? ?Past Psychiatric History: See H&P ? ?Past Medical History:  ?Past Medical History:  ?Diagnosis Date  ? Dysmenorrhea   ? History reviewed. No pertinent surgical history. ?Family History:  ?Family History  ?Problem Relation Age of Onset  ? Hypertension Maternal Grandmother   ? Hypertension Maternal Grandfather   ? Diabetes Paternal Grandfather   ? Heart disease Paternal Grandfather   ? ?Family Psychiatric  History: See H&P ?Social History:  ?Social History  ? ?Substance and Sexual Activity  ?Alcohol Use Not Currently  ?  Alcohol/week: 0.0 standard drinks  ?   ?Social History  ? ?Substance and Sexual Activity  ?Drug Use Yes  ? Types: Cocaine, Marijuana, Methamphetamines  ? Comment: last used meth and cocaine 2 years ago, still uses marijuana   ?  ?Social History  ? ?Socioeconomic History  ? Marital status: Single  ?  Spouse name: Not on  file  ? Number of children: Not on file  ? Years of education: Not on file  ? Highest education level: Not on file  ?Occupational History  ? Not on file  ?Tobacco Use  ? Smoking status: Some Days  ?  Years: 3.00  ?  Types: Cigarettes  ? Smokeless tobacco: Never  ?Vaping Use  ? Vaping Use: Former  ?Substance and Sexual Activity  ? Alcohol use: Not Currently  ?  Alcohol/week: 0.0 standard drinks  ? Drug use: Yes  ?  Types: Cocaine, Marijuana, Methamphetamines  ?  Comment: last used meth and cocaine 2 years ago, still uses marijuana   ? Sexual activity: Yes  ?  Birth control/protection: None  ?Other Topics Concern  ? Not on file  ?Social History Narrative  ? Not on file  ? ?Social Determinants of Health  ? ?Financial Resource Strain: Not on file  ?Food Insecurity: Not on file  ?Transportation Needs: Not on file  ?Physical Activity: Not on file  ?Stress: Not on file  ?Social Connections: Not on file  ? ?Additional Social History:  ?  ?  ?  ?  ?  ?  ?  ?  ?  ?  ?  ? ?Sleep: Good ? ?Appetite:  Good ? ?Current Medications: ?Current Facility-Administered Medications  ?Medication Dose Route Frequency Provider Last Rate Last Admin  ? acetaminophen (TYLENOL) tablet 650 mg  650 mg Oral Q6H PRN Court Joy, PA-C      ? alum & mag hydroxide-simeth (MAALOX/MYLANTA) 200-200-20 MG/5ML suspension 30 mL  30 mL Oral Q4H PRN Court Joy, PA-C      ? folic acid (FOLVITE) tablet 1 mg  1 mg Oral Daily Court Joy, PA-C   1 mg at 06/18/21 0900  ? hydrOXYzine (ATARAX) tablet 25 mg  25 mg Oral Q6H PRN Bobbitt, Shalon E, NP      ? loperamide (IMODIUM) capsule 2-4 mg  2-4 mg Oral PRN Bobbitt, Franchot Mimes, NP      ? LORazepam (ATIVAN) tablet 1 mg  1 mg Oral Q6H PRN Bobbitt, Shalon E, NP      ? LORazepam (ATIVAN) tablet 1 mg  1 mg Oral BID Bobbitt, Shalon E, NP      ? Followed by  ? [START ON 06/20/2021] LORazepam (ATIVAN) tablet 1 mg  1 mg Oral Daily Bobbitt, Shalon E, NP      ? magnesium hydroxide (MILK OF MAGNESIA) suspension 30 mL   30 mL Oral Daily PRN Court Joy, PA-C      ? multivitamin with minerals tablet 1 tablet  1 tablet Oral Daily Bobbitt, Shalon E, NP   1 tablet at 06/18/21 0900  ? ondansetron (ZOFRAN-ODT) disintegrating tablet 4 mg  4 mg Oral Q6H PRN Bobbitt, Shalon E, NP   4 mg at 06/16/21 1338  ? sertraline (ZOLOFT) tablet 100 mg  100 mg Oral Daily Eliseo Gum B, MD   100 mg at 06/18/21 0900  ? thiamine tablet 100 mg  100 mg Oral Daily Bobbitt, Shalon E, NP   100 mg at 06/18/21 0900  ? ? ?Lab  Results: No results found for this or any previous visit (from the past 48 hour(s)). ? ?Blood Alcohol level:  ?Lab Results  ?Component Value Date  ? ETH 17 (H) 06/15/2021  ? ETH <10 05/15/2020  ? ? ?Metabolic Disorder Labs: ?No results found for: HGBA1C, MPG ?No results found for: PROLACTIN ?No results found for: CHOL, TRIG, HDL, CHOLHDL, VLDL, LDLCALC ? ?Physical Findings: ?AIMS: Facial and Oral Movements ?Muscles of Facial Expression: None, normal ?Lips and Perioral Area: None, normal ?Jaw: None, normal ?Tongue: None, normal,Extremity Movements ?Upper (arms, wrists, hands, fingers): None, normal ?Lower (legs, knees, ankles, toes): None, normal, Trunk Movements ?Neck, shoulders, hips: None, normal, Overall Severity ?Severity of abnormal movements (highest score from questions above): None, normal ?Incapacitation due to abnormal movements: None, normal ?Patient's awareness of abnormal movements (rate only patient's report): No Awareness, Dental Status ?Current problems with teeth and/or dentures?: No ?Does patient usually wear dentures?: No  ?CIWA:  CIWA-Ar Total: 0 ?COWS:    ? ?Musculoskeletal: ?Strength & Muscle Tone: within normal limits ?Gait & Station:  Remains in bed ?Patient leans: N/A ? ?Psychiatric Specialty Exam: ? ?Presentation  ?General Appearance: Appropriate for Environment ? ?Eye Contact:Minimal ? ?Speech:Clear and Coherent ? ?Speech Volume:Decreased ? ?Mood and Affect  ?Mood:Depressed ? ?Affect:Flat ? ? ?Thought Process   ?Thought Processes:Linear ? ?Descriptions of Associations:intact ? ?Orientation:Full (Time, Place and Person) ? ?Thought Content:She reports SI with plan if she were discharged but can contract for safety on

## 2021-06-18 NOTE — Plan of Care (Signed)
Nurse discussed coping skills with patient.  

## 2021-06-18 NOTE — Plan of Care (Signed)
  Problem: Education: Goal: Emotional status will improve Outcome: Not Progressing Goal: Mental status will improve Outcome: Not Progressing   

## 2021-06-18 NOTE — BH IP Treatment Plan (Signed)
Interdisciplinary Treatment and Diagnostic Plan Update ? ?06/18/2021 ?Time of Session: 10:45am ?Martha Mercado ?MRN: [id number] ? ?Principal Diagnosis: Suicidal ideations ? ?Secondary Diagnoses: Principal Problem: ?  Suicidal ideations ? ? ?Current Medications:  ?Current Facility-Administered Medications  ?Medication Dose Route Frequency Provider Last Rate Last Admin  ? acetaminophen (TYLENOL) tablet 650 mg  650 mg Oral Q6H PRN Dara Hoyer, PA-C      ? alum & mag hydroxide-simeth (MAALOX/MYLANTA) 200-200-20 MG/5ML suspension 30 mL  30 mL Oral Q4H PRN Dara Hoyer, PA-C      ? folic acid (FOLVITE) tablet 1 mg  1 mg Oral Daily Dara Hoyer, PA-C   1 mg at 06/18/21 0900  ? hydrOXYzine (ATARAX) tablet 25 mg  25 mg Oral Q6H PRN Bobbitt, Shalon E, NP      ? loperamide (IMODIUM) capsule 2-4 mg  2-4 mg Oral PRN Bobbitt, Lennie Muckle, NP      ? LORazepam (ATIVAN) tablet 1 mg  1 mg Oral Q6H PRN Bobbitt, Shalon E, NP      ? LORazepam (ATIVAN) tablet 1 mg  1 mg Oral TID Bobbitt, Shalon E, NP   1 mg at 06/18/21 0900  ? Followed by  ? LORazepam (ATIVAN) tablet 1 mg  1 mg Oral BID Bobbitt, Shalon E, NP      ? Followed by  ? [START ON 06/20/2021] LORazepam (ATIVAN) tablet 1 mg  1 mg Oral Daily Bobbitt, Shalon E, NP      ? magnesium hydroxide (MILK OF MAGNESIA) suspension 30 mL  30 mL Oral Daily PRN Dara Hoyer, PA-C      ? multivitamin with minerals tablet 1 tablet  1 tablet Oral Daily Bobbitt, Shalon E, NP   1 tablet at 06/18/21 0900  ? ondansetron (ZOFRAN-ODT) disintegrating tablet 4 mg  4 mg Oral Q6H PRN Bobbitt, Shalon E, NP   4 mg at 06/16/21 1338  ? sertraline (ZOLOFT) tablet 100 mg  100 mg Oral Daily Damita Dunnings B, MD   100 mg at 06/18/21 0900  ? thiamine tablet 100 mg  100 mg Oral Daily Bobbitt, Shalon E, NP   100 mg at 06/18/21 0900  ? ?PTA Medications: ?No medications prior to admission.  ? ? ?Patient Stressors: Financial difficulties   ?Substance abuse   ? ?Patient Strengths: General fund of knowledge   ?Physical Health  ? ?Treatment Modalities: Medication Management, Group therapy, Case management,  ?1 to 1 session with clinician, Psychoeducation, Recreational therapy. ? ? ?Physician Treatment Plan for Primary Diagnosis: Suicidal ideations ?Long Term Goal(s): Improvement in symptoms so as ready for discharge  ? ?Short Term Goals: Ability to identify changes in lifestyle to reduce recurrence of condition will improve ?Ability to verbalize feelings will improve ?Ability to disclose and discuss suicidal ideas ?Ability to demonstrate self-control will improve ?Ability to identify and develop effective coping behaviors will improve ?Ability to maintain clinical measurements within normal limits will improve ?Ability to identify triggers associated with substance abuse/mental health issues will improve ? ?Medication Management: Evaluate patient's response, side effects, and tolerance of medication regimen. ? ?Therapeutic Interventions: 1 to 1 sessions, Unit Group sessions and Medication administration. ? ?Evaluation of Outcomes: Not Met ? ?Physician Treatment Plan for Secondary Diagnosis: Principal Problem: ?  Suicidal ideations ? ?Long Term Goal(s): Improvement in symptoms so as ready for discharge  ? ?Short Term Goals: Ability to identify changes in lifestyle to reduce recurrence of condition will improve ?Ability to verbalize feelings will improve ?Ability to  disclose and discuss suicidal ideas ?Ability to demonstrate self-control will improve ?Ability to identify and develop effective coping behaviors will improve ?Ability to maintain clinical measurements within normal limits will improve ?Ability to identify triggers associated with substance abuse/mental health issues will improve    ? ?Medication Management: Evaluate patient's response, side effects, and tolerance of medication regimen. ? ?Therapeutic Interventions: 1 to 1 sessions, Unit Group sessions and Medication administration. ? ?Evaluation of Outcomes:  Not Met ? ? ?RN Treatment Plan for Primary Diagnosis: Suicidal ideations ?Long Term Goal(s): Knowledge of disease and therapeutic regimen to maintain health will improve ? ?Short Term Goals: Ability to remain free from injury will improve, Ability to verbalize frustration and anger appropriately will improve, Ability to demonstrate self-control, Ability to participate in decision making will improve, Ability to identify and develop effective coping behaviors will improve, and Compliance with prescribed medications will improve ? ?Medication Management: RN will administer medications as ordered by provider, will assess and evaluate patient's response and provide education to patient for prescribed medication. RN will report any adverse and/or side effects to prescribing provider. ? ?Therapeutic Interventions: 1 on 1 counseling sessions, Psychoeducation, Medication administration, Evaluate responses to treatment, Monitor vital signs and CBGs as ordered, Perform/monitor CIWA, COWS, AIMS and Fall Risk screenings as ordered, Perform wound care treatments as ordered. ? ?Evaluation of Outcomes: Not Met ? ? ?LCSW Treatment Plan for Primary Diagnosis: Suicidal ideations ?Long Term Goal(s): Safe transition to appropriate next level of care at discharge, Engage patient in therapeutic group addressing interpersonal concerns. ? ?Short Term Goals: Engage patient in aftercare planning with referrals and resources, Increase social support, Increase ability to appropriately verbalize feelings, Increase emotional regulation, Facilitate patient progression through stages of change regarding substance use diagnoses and concerns, Identify triggers associated with mental health/substance abuse issues, and Increase skills for wellness and recovery ? ?Therapeutic Interventions: Assess for all discharge needs, 1 to 1 time with Education officer, museum, Explore available resources and support systems, Assess for adequacy in community support network,  Educate family and significant other(s) on suicide prevention, Complete Psychosocial Assessment, Interpersonal group therapy. ? ?Evaluation of Outcomes: Not Met ? ? ?Progress in Treatment: ?Attending groups: No. and As evidenced by:  No groups currently being held ?Participating in groups: No. ?Taking medication as prescribed: Yes. ?Toleration medication: Yes. ?Family/Significant other contact made: No, will contact:  patient declined consents ?Patient understands diagnosis: Yes. ?Discussing patient identified problems/goals with staff: Yes. ?Medical problems stabilized or resolved: Yes. ?Denies suicidal/homicidal ideation: Yes. ?Issues/concerns per patient self-inventory: No. ? ? ?New problem(s) identified: No, Describe:  none ? ?New Short Term/Long Term Goal(s): detox, medication management for mood stabilization; elimination of SI thoughts; development of comprehensive mental wellness/sobriety plan ? ?Patient Goals:  "To transfer to a rehab" ? ?Discharge Plan or Barriers: Patient recently admitted. CSW will continue to follow and assess for appropriate referrals and possible discharge planning.  ? ? ?Reason for Continuation of Hospitalization: Anxiety ?Depression ?Medication stabilization ?Suicidal ideation ?Withdrawal symptoms ? ?Estimated Length of Stay: 3-5 days ? ? ?Scribe for Treatment Team: ?Vassie Moselle, LCSW ?06/18/2021 ?11:28 AM ?

## 2021-06-18 NOTE — Progress Notes (Signed)
1:1 D/C note ?D:  Martha Mercado is resting with her eyes closed and appears to be asleep.  Breathing even and unlabored.  She appears to be in no physical distress. ?A:  Q 15 minute checks maintained for safety. ?R:  Pt remains safe on the unit. ?

## 2021-06-18 NOTE — Progress Notes (Signed)
D:  Patient denied SI and HI, contracts for safety.  Denied A/V hallucinations.  Denied pain. ?A:  Medications administered per MD orders.  Emotional support and encouragement given patient. ?R:  Safety maintained with 15 minute checks. ? ?Patient stated "I do have thoughts to hurt myself after discharge but not while at [facility]." ? ?

## 2021-06-19 LAB — RPR: RPR Ser Ql: NONREACTIVE

## 2021-06-19 MED ORDER — SERTRALINE HCL 100 MG PO TABS: 100.0000 mg | ORAL_TABLET | Freq: Every day | ORAL | 0 refills | Status: DC

## 2021-06-19 MED ORDER — TRAZODONE HCL 50 MG PO TABS
50.0000 mg | ORAL_TABLET | Freq: Once | ORAL | Status: AC
  Administered 2021-06-19: 50 mg via ORAL
  Filled 2021-06-19 (×2): qty 1

## 2021-06-19 MED ORDER — HYDROXYZINE HCL 25 MG PO TABS: 25.0000 mg | ORAL_TABLET | Freq: Four times a day (QID) | ORAL | 0 refills | Status: DC | PRN

## 2021-06-19 NOTE — Group Note (Signed)
Recreation Therapy Group Note ? ? ?Group Topic:Problem Solving  ?Group Date: 06/19/2021 ?Start Time: 1054 ?End Time: 1122 ?Facilitators: Victorino Sparrow, LRT,CTRS ?Location:  [place] ? ? ?Goal Area(s) Addresses:  ?Patient will effectively work with peer towards shared goal.  ?Patient will identify skills used to make activity successful.  ?Patient will identify how skills used during activity can be applied to reach post d/c goals.  ? ?Group Description: Tallest [employer]. In teams of 2, patients were given 25 small craft pipe cleaners. Using the materials provided, patients were instructed to compete again the opposing team(s) to build the tallest free-standing structure from floor level. The activity was timed; difficulty increased by Probation officer as Pharmacist, hospital continued.  Systematically resources were removed with additional directions for example, placing one arm behind their back, working in silence, and shape stipulations. LRT facilitated post-activity discussion reviewing team processes and necessary skills involved in completion. Patients were encouraged to reflect how the skills utilized, or not utilized, in this activity can be incorporated to positively impact support systems post discharge. ? ? ?Participation Level: N/A ?  ? ?Clinical Observations/Individualized Feedback:   ? ? ?Plan: Continue to engage patient in RT group sessions 2-3x/week. ? ? ?Victorino Sparrow, LRT,CTRS ?06/19/2021 1:07 PM ?

## 2021-06-19 NOTE — BHH Counselor (Signed)
CSW provided the Pt with the phone numbers to [facility] [place], and [facility].  CSW will also call and attempt to put in a referral to [facility] in [facility].  CSW will give the Pt a brochure for [facility] as well so the Pt can call and inquire about their treatment program for women.  CSW will continue to discuss further treatment options with the Pt.  ?

## 2021-06-19 NOTE — BHH Suicide Risk Assessment (Deleted)
[facility] Discharge Suicide Risk Assessment ? ? ?Principal Problem: Substance induced mood disorder ([place]) ?Discharge Diagnoses: Principal Problem: ?  Substance induced mood disorder ([place]) ?Active Problems: ?  Suicidal ideations ?  Alcohol abuse ?  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  Cannabis abuse ?  Cocaine abuse ([place]) ?  PTSD (post-traumatic stress disorder) ?  Social anxiety disorder ? ? ?Total Time spent with patient: 20 minutes ? ?Musculoskeletal: ?Strength & Muscle Tone: within normal limits ?Gait & Station: normal ?Patient leans: N/A ? ?Psychiatric Specialty Exam ? ?Presentation  ?General Appearance: Appropriate for Environment ? ?Eye Contact:Minimal ? ?Speech:Clear and Coherent ? ?Speech Volume:Decreased ? ?Handedness:No data recorded ? ?Mood and Affect  ?Mood:Depressed ? ?Duration of Depression Symptoms: Greater than two weeks ? ?Affect:Flat ? ? ?Thought Process  ?Thought Processes:Linear ? ?Descriptions of Associations:Circumstantial ? ?Orientation:Full (Time, Place and Person) ? ?Thought Content:Logical ? ?History of Schizophrenia/Schizoaffective disorder:No ? ?Duration of Psychotic Symptoms:No data recorded ?Hallucinations:Hallucinations: None ? ?Ideas of Reference:None ? ?Suicidal Thoughts:Suicidal Thoughts: Yes, Active ?SI Active Intent and/or Plan: Without Means to [employer] (Can contract for safety while in the hospital) ? ?Homicidal Thoughts:Homicidal Thoughts: No ? ? ?Sensorium  ?Memory:Immediate Good; Recent Good ? ?Judgment:-- (Improving) ? ?Insight:None ? ? ?Executive Functions  ?Concentration:Fair ? ?Attention Span:Fair ? ?Recall:No data recorded ?[place] ? ?Language:Fair ? ? ?Psychomotor Activity  ?Psychomotor Activity:Psychomotor Activity: Psychomotor Retardation ? ? ?Assets  ?Assets:Resilience ? ? ?Sleep  ?Sleep:Sleep: Good ? ? ?Physical Exam: ?Physical Exam ?Vitals and nursing note reviewed.  ?Constitutional:   ?   Appearance: Normal appearance.  ?HENT:  ?   Head: Normocephalic.  ?   Nose:  Nose normal.  ?Eyes:  ?   Extraocular Movements: Extraocular movements intact.  ?Cardiovascular:  ?   Rate and Rhythm: Normal rate.  ?Pulmonary:  ?   Effort: Pulmonary effort is normal.  ?Musculoskeletal:     ?   General: Normal range of motion.  ?   Cervical back: Normal range of motion and neck supple.  ?Neurological:  ?   General: No focal deficit present.  ?   Mental Status: She is alert and oriented to person, place, and time.  ?Psychiatric:     ?   Mood and Affect: Mood normal.     ?   Behavior: Behavior normal.     ?   Thought Content: Thought content normal.     ?   Judgment: Judgment normal.  ? ?Review of Systems  ?Constitutional:  Negative for chills and fever.  ?Eyes:  Negative for blurred vision.  ?Respiratory:  Negative for cough.   ?Cardiovascular:  Negative for chest pain and palpitations.  ?Gastrointestinal:  Negative for abdominal pain, constipation, diarrhea, nausea and vomiting.  ?Genitourinary:  Negative for dysuria and urgency.  ?Musculoskeletal:  Negative for back pain and myalgias.  ?Skin:  Negative for rash.  ?Neurological:  Negative for dizziness and headaches.  ?Psychiatric/Behavioral:  Positive for substance abuse. Negative for depression, hallucinations and suicidal ideas. The patient does not have insomnia.   ?Blood pressure 102/68, pulse 75, temperature 98.1 ?F (36.7 ?C), temperature source Oral, resp. rate 16, height 5\' 8"  (1.727 m), weight 70.3 kg, SpO2 100 %. Body mass index is 23.57 kg/m?. ? ?Mental Status Per Nursing Assessment::   ?On Admission:  Suicidal ideation indicated by patient ? ?Demographic Factors:  ?Adolescent or young adult and Low socioeconomic status ? ?Loss Factors: ?Financial problems/change in socioeconomic status ? ?Historical Factors: ?Prior suicide attempts ? ?Risk Reduction Factors:   ?NA ? ?  Continued Clinical Symptoms:  ?Alcohol/Substance Abuse/Dependencies ? ?Cognitive Features That Contribute To Risk:  ?Closed-mindedness   ? ?Suicide Risk:  ?Mild:  Suicidal  ideation of limited frequency, intensity, duration, and specificity.  There are no identifiable plans, no associated intent, mild dysphoria and related symptoms, good self-control (both objective and subjective assessment), few other risk factors, and identifiable protective factors, including available and accessible social support. ? ? Follow-up Information   ? ? [facility] Follow up on 07/13/2021.   ?Why: You have an appointment for medication management services on 07/13/21 at 10:00 am.   This appointment will be Virtual. ?Contact information: ?[address] ?[place] [address] ?[phone] ? ? ?  ?  ? ? [facility] Follow up.   ?Specialty: Addiction Medicine ?Why: Referral made ?Contact information: ?[id number] ?[place] [address] ?[phone] ? ? ?  ?  ? ? [facility] Follow up.   ?Why: Referral made ?Contact information: ?[address] [address] ?[id number] ? ? ?  ?  ? ? [facility] Follow up on 06/28/2021.   ?Why: You have an appointment for therapy services on 06/28/21 at 10:00 am.   This appointment will be Virtual. ?Contact information: ?[place], [place] ?[place] [address] ?[phone] ? ? ?  ?  ? ?  ?  ? ?  ? ? ?Plan Of Care/Follow-up recommendations:  ?Activity:  ad lib ?Diet:  regular ?Other:    ?Prescriptions for new medications provided for the patient to bridge to follow up appointment. The patient was informed that refills for these prescriptions are generally not provided, and patient is encouraged to attend all follow up appointments to address medication refills and adjustments.  ? ?Today's discharge was reviewed with treatment team, and the team is in agreement that the patient is ready for discharge. The patient is was of the discharge plan for today and has been given opportunity to ask questions. At time of discharge, the  patient does not vocalize any acute harm to self or others, is goal directed, able to advocate for self and organizational baseline.  ? ?At discharge, the patient is instructed to:  ?Take all medications as prescribed. ?Report any adverse effects and or reactions from the medicines to her outpatient provider promptly.  ?Do not engage in alcohol and/or illegal drug use while on prescription medicines.  ?In the event of worsening symptoms, patient is instructed to call the crisis hotline, 911 and or go to the nearest ED for appropriate evaluation and treatment of symptoms.  ?Follow-up with primary care provider for further care of medical issues, concerns and or health care needs. ?* Substance abuse follow up: it is recommended that you follow up with community support treatment, like AA/NA. It is also recommended that the patient attend 90 meetings in 90 days, otherwise known as "60 in 27" ?* Pregnancy: Mood stabilizing agents and other medications used in psychiatry may pose risk to pregnancy. Women of childbearing age are advised to use birth control. If you are planning on becoming pregnant, please discuss with both your OBGYN and your psychiatrist prior to stopping medication and prior to pregnancy.  ? ?Maida Sale, MD ?06/19/2021, 12:01 PM ?

## 2021-06-19 NOTE — Progress Notes (Signed)
[facility] MD Progress Note ? ?06/19/2021 4:53 PM ?Martha Mercado  ?MRN:  [id number] ?Subjective:  Martha Mercado is a 26 yo patient w/ PPH of Polysubstance abuse who transferred from [facility] after presenting endorsing N/V after use of Etoh and substances she also endorsed SI on assessment. Patient was transferred to [facility] after being declared medical stable. ?  ?  ?Case was discussed in the multidisciplinary team. MAR was reviewed and patient was compliant with medications.  She did not require any PRN's for agitation. ?  ?  ?Psychiatric Team made the following recommendations yesterday:  ? ?-Continuous observation ?- Zoloft 100 mg daily ?- Zyprexa to 2.5 mg nightly, oversedated on 5 mg ? ?Assessment today patient reports that she is irritated that she is being "made to stay here". I clarified that she is not, in fact, being held here against her will and is able to leave if she desires. She says that she wants to see how much money she has, that she has someone that can pick her up and that she doesn't want to stay. She is not sure that she wants rehab, that she might but not as her first choice. She denied suicidal ideation at that time, along with hallucinations. She was defensive with regards to discussing her stressors and her situation. She acknowledged that the drugs were a bigger problem than homelessness, but this was also well guarded. She ended the interview at that time.  ? Later she changed her mind, and decided to stay and seek residential treatment (see note A. Webster LCSWA at 1336) ? Patient approached this writer again after 1600 and said that she had again changed her mind and wanted to leave now, this evening. I explained to her that this was too late, and that it would have to be reviewed in the morning. I encouraged her to think about this overnight and be certain of her wishes.  ? ?Principal Problem: Substance induced mood disorder (HCC) ?Diagnosis: Principal Problem: ?  Substance induced mood disorder  (HCC) ?Active Problems: ?  Suicidal ideations ?  Alcohol abuse ?  Hallucinogen abuse (HCC) ?  Cannabis abuse ?  Cocaine abuse (HCC) ?  PTSD (post-traumatic stress disorder) ?  Social anxiety disorder ? ? ?Past Psychiatric History: See H&P ? ?Past Medical History:  ?Past Medical History:  ?Diagnosis Date  ? Dysmenorrhea   ? History reviewed. No pertinent surgical history. ?Family History:  ?Family History  ?Problem Relation Age of Onset  ? Hypertension Maternal Grandmother   ? Hypertension Maternal Grandfather   ? Diabetes Paternal Grandfather   ? Heart disease Paternal Grandfather   ? ?Family Psychiatric  History: See H&P ?Social History:  ?Social History  ? ?Substance and Sexual Activity  ?Alcohol Use Not Currently  ? Alcohol/week: 0.0 standard drinks  ?   ?Social History  ? ?Substance and Sexual Activity  ?Drug Use Yes  ? Types: Cocaine, Marijuana, Methamphetamines  ? Comment: last used meth and cocaine 2 years ago, still uses marijuana   ?  ?Social History  ? ?Socioeconomic History  ? Marital status: Single  ?  Spouse name: Not on file  ? Number of children: Not on file  ? Years of education: Not on file  ? Highest education level: Not on file  ?Occupational History  ? Not on file  ?Tobacco Use  ? Smoking status: Some Days  ?  Years: 3.00  ?  Types: Cigarettes  ? Smokeless tobacco: Never  ?Vaping Use  ? Vaping  Use: Former  ?Substance and Sexual Activity  ? Alcohol use: Not Currently  ?  Alcohol/week: 0.0 standard drinks  ? Drug use: Yes  ?  Types: Cocaine, Marijuana, Methamphetamines  ?  Comment: last used meth and cocaine 2 years ago, still uses marijuana   ? Sexual activity: Yes  ?  Birth control/protection: None  ?Other Topics Concern  ? Not on file  ?Social History Narrative  ? Not on file  ? ?Social Determinants of Health  ? ?Financial Resource Strain: Not on file  ?Food Insecurity: Not on file  ?Transportation Needs: Not on file  ?Physical Activity: Not on file  ?Stress: Not on file  ?Social Connections:  Not on file  ? ?Additional Social History:  ?  ?  ?  ?  ?  ?  ?  ?  ?  ?  ?  ? ?Sleep: Good ? ?Appetite:  Good ? ?Current Medications: ?Current Facility-Administered Medications  ?Medication Dose Route Frequency Provider Last Rate Last Admin  ? acetaminophen (TYLENOL) tablet 650 mg  650 mg Oral Q6H PRN Court Joy, PA-C      ? alum & mag hydroxide-simeth (MAALOX/MYLANTA) 200-200-20 MG/5ML suspension 30 mL  30 mL Oral Q4H PRN Court Joy, PA-C      ? folic acid (FOLVITE) tablet 1 mg  1 mg Oral Daily Court Joy, PA-C   1 mg at 06/19/21 0759  ? [START ON 06/20/2021] LORazepam (ATIVAN) tablet 1 mg  1 mg Oral Daily Bobbitt, Shalon E, NP      ? magnesium hydroxide (MILK OF MAGNESIA) suspension 30 mL  30 mL Oral Daily PRN Court Joy, PA-C      ? multivitamin with minerals tablet 1 tablet  1 tablet Oral Daily Bobbitt, Shalon E, NP   1 tablet at 06/19/21 0758  ? sertraline (ZOLOFT) tablet 100 mg  100 mg Oral Daily Eliseo Gum B, MD   100 mg at 06/19/21 0759  ? thiamine tablet 100 mg  100 mg Oral Daily Bobbitt, Shalon E, NP   100 mg at 06/19/21 4496  ? ? ?Lab Results:  ?Results for orders placed or performed during the hospital encounter of 06/16/21 (from the past 48 hour(s))  ?TSH     Status: None  ? Collection Time: 06/18/21  6:12 PM  ?Result Value Ref Range  ? TSH 0.798 0.350 - 4.500 uIU/mL  ?  Comment: Performed by a 3rd Generation assay with a functional sensitivity of <=0.01 uIU/mL. ?Performed at [facility], [address], [place], [place] [address] ?  ?RPR     Status: None  ? Collection Time: 06/18/21  6:12 PM  ?Result Value Ref Range  ? RPR Ser Ql NON REACTIVE NON REACTIVE  ?  Comment: Performed at [facility], [address], [place], [place] [address]  ?HIV Antibody (routine testing w rflx)     Status: None  ? Collection Time: 06/18/21  6:12 PM  ?Result Value Ref Range  ? HIV Screen 4th Generation wRfx Non Reactive Non Reactive  ?  Comment: Performed at [facility], [address] [place], [place] [address]  ? ? ?Blood Alcohol level:  ?Lab Results  ?Component Value Date  ? ETH 17 (H) 06/15/2021  ? ETH <10 05/15/2020  ? ? ?Metabolic Disorder Labs: ?No results found for: HGBA1C, MPG ?No results found for: PROLACTIN ?No results found for: CHOL, TRIG, HDL, CHOLHDL, VLDL, LDLCALC ? ?Physical Findings: ?AIMS: Facial  and Oral Movements ?Muscles of Facial Expression: None, normal ?Lips and Perioral Area: None, normal ?Jaw: None, normal ?Tongue: None, normal,Extremity Movements ?Upper (arms, wrists, hands, fingers): None, normal ?Lower (legs, knees, ankles, toes): None, normal, Trunk Movements ?Neck, shoulders, hips: None, normal, Overall Severity ?Severity of abnormal movements (highest score from questions above): None, normal ?Incapacitation due to abnormal movements: None, normal ?Patient's awareness of abnormal movements (rate only patient's report): No Awareness, Dental Status ?Current problems with teeth and/or dentures?: No ?Does patient usually wear dentures?: No  ?CIWA:  CIWA-Ar Total: 1 ?COWS:    ? ?Musculoskeletal: ?Strength & Muscle Tone: within normal limits ?Gait & Station:  Remains in bed ?Patient leans: N/A ? ?Psychiatric Specialty Exam: ? ?Presentation  ?General Appearance: Appropriate for Environment; Casual ? ?Eye Contact:Fair ? ?Speech:Normal Rate ? ?Speech Volume:Normal ? ?Mood and Affect  ?Mood:Irritable ? ?Affect:Full Range ? ? ?Thought Process  ?Thought Processes:Linear ? ?Descriptions of Associations:intact ? ?Orientation:Full (Time, Place and Person) ? ?Thought Content: denies current SI; she denies HI, AVH, paranoia, ideas of reference or first rank symptoms ? ?History of Schizophrenia/Schizoaffective disorder:No ? ?Hallucinations:Hallucinations: None ? ?Ideas of Reference:None ? ?Suicidal Thoughts:Suicidal Thoughts: No ?SI Active Intent and/or Plan: Without Means to [employer] (Can contract for safety while in the hospital) ? ?Homicidal  Thoughts:Homicidal Thoughts: No ? ? ?Sensorium  ?Memory:Immediate Fair; Recent Fair; Remote Fair ? ?Judgment:Fair ? ?Insight:Fair ? ? ?Executive Functions  ?Concentration:Fair ? ?Attention Span:Fair ? ?Recall:Good ? ?

## 2021-06-19 NOTE — BHH Counselor (Signed)
11:30am - CSW spoke with the Pt who stated that she wanted to be discharged from the [facility] facility.  CSW discussed with the Pt where she would live if discharged.  The Pt stated that she would go back to a hotel and would agree to outpatient therapy and medication management.  CSW informed the doctor and the doctor went to discuss this further with the Pt.  ? ?1:00pm - CSW was informed that the Pt was being discharged from [facility] facility.  CSW went to discuss discharge planning with the Pt.  The Pt stated that she did not want to be discharged at the time and wanted to be sent to a residential treatment facility.  CSW explained to the Pt that she would need to contact the numbers that were provided to her this morning.  The Pt agreed to do this.  CSW also explained that bed-to-bed placement may not be an option and that if there are no available beds at this time then the Pt will have to discharge to a shelter or another housing resource and call daily until a bed becomes available.  The Pt stated that she understood this information as well.  CSW informed the Doctor that the Pt does not wish to discharge from the [facility] facility at this time.  Doctor states that she will cancel the discharge orders.  ?

## 2021-06-19 NOTE — Progress Notes (Signed)
D:  Patient denied SI and HI, contracts for safety.  Denied A/V hallucinations.  Denied pain. ?A:  Medications administered per MD orders.  Emotional support and encouragement given patient. ?R:  Safety maintained with 15 minute checks. ? ?Patient stated she slept approximately 8 hours last night. ? ?

## 2021-06-19 NOTE — Plan of Care (Signed)
Patient stayed active in the milieu with peers and had no sign of distress. Frequently asking when she will be discharged. Received bedtime medications and went to bed. Currently sleeping and has no sign of discomfort.  ?

## 2021-06-19 NOTE — BHH Counselor (Signed)
CSW provided the Pt with a packet that contains information including shelter and housing resources, free and reduced price food information, clothing resources, crisis center information, a [employer] card, and suicide prevention information.   

## 2021-06-19 NOTE — Plan of Care (Signed)
Nurse discussed coping skills and anxiety with patient.  

## 2021-06-19 NOTE — Progress Notes (Signed)
Patient stated she wanted to sign 72 hr request for discharge.  Then she changed her mind and does not want to sign 72 hr form. ? ?

## 2021-06-20 LAB — GC/CHLAMYDIA PROBE AMP (~~LOC~~) NOT AT ARMC
Chlamydia: NEGATIVE
Comment: NEGATIVE
Comment: NORMAL
Neisseria Gonorrhea: NEGATIVE

## 2021-06-20 MED ORDER — HYDROXYZINE HCL 25 MG PO TABS
25.0000 mg | ORAL_TABLET | Freq: Once | ORAL | Status: AC
  Administered 2021-06-20: 25 mg via ORAL
  Filled 2021-06-20 (×2): qty 1

## 2021-06-20 MED ORDER — TRAZODONE HCL 50 MG PO TABS
50.0000 mg | ORAL_TABLET | Freq: Once | ORAL | Status: AC
  Administered 2021-06-20: 50 mg via ORAL
  Filled 2021-06-20 (×2): qty 1

## 2021-06-20 NOTE — Plan of Care (Deleted)
Nursing discharge note: Patient discharged home per MD order.  Patient received all personal belongings from unit and locker.  Reviewed AVS/transition record with patient and he indicates understanding.  All follow up appointments are listed on AVS. Patient denies any thoughts of self harm; she denies any AVH. Patient left ambulatory with her ride.  ? ?

## 2021-06-20 NOTE — Group Note (Signed)
Group Date: 06/20/2021 ?Start Time: 1300 ?End Time: 1400 ?  ?Type of Therapy and Topic:  Group Therapy: Self-Esteem  ?  ?Participation Level:  Active ?  ?Description of Group:   Due to the acuity on the unit, staff recommended no close contact at this time so group was not held.  Patient was provided with written information and worksheets about Self-Esteem.  CSW was reviewed the packet with the Pt and answered all questions.  ?  ? ? ?Jordon Bourquin, LCSW, LCAS ?Clincal Social Worker  ?[facility] ? ?

## 2021-06-20 NOTE — Progress Notes (Addendum)
D: Patient states she is not ready for discharge. She denies any thoughts of harming herself; she denies any AVH. Per SW, patient has a list of residential treatment centers to call. She has been observed calling some numbers today. She denies any thoughts of self harm. Her CIWA scores have been low. She denies any withdrawal symptoms.Patient does not appear to be responding to internal stimuli. ? ?A: Continue to monitor medication management and MD orders.  Safety checks completed every 15 minutes per protocol.  Offer support and encouragement as needed. ? ?R: Patient is receptive to staff; her behavior is appropriate.   ? ? 06/20/21 0900  ?Psych Admission Type (Psych Patients Only)  ?Admission Status Voluntary  ?Psychosocial Assessment  ?Patient Complaints Anxiety;Depression  ?Eye Contact Fair  ?Facial Expression Anxious  ?Affect Appropriate to circumstance  ?Speech Soft  ?Interaction Minimal  ?Motor Activity Slow  ?Appearance/Hygiene Unremarkable  ?Behavior Characteristics Cooperative  ?Mood Pleasant  ?Thought Process  ?Coherency WDL  ?Content WDL  ?Delusions None reported or observed  ?Perception WDL  ?Hallucination None reported or observed  ?Judgment Poor  ?Confusion None  ?Danger to Self  ?Current suicidal ideation? Denies  ?Agreement Not to Harm Self Yes  ?Description of Agreement verbal  ?Danger to Others  ?Danger to Others None reported or observed  ? ? ?

## 2021-06-20 NOTE — BHH Counselor (Signed)
CSW provided the Pt with a list of residential treatment centers in [place] that accept her insurance.  CSW discussed these treatment centers with the Pt.  CSW has sent in referrals to these treatmetn centers.  CSW also discussed with the Pt why other centers were not an option at this time including self-pay, other insurances, and centers that only accept men or have limited services for women.  CSW allowed the Pt to ask questions about these centers and make decisions about which centers would work best for her.  CSW will follow up with the Pt to see if there are any treatment centers with available beds at this time.   ? ?CSW has been informed that [facility] and [facility] do not have available beds for at least 2 weeks.  CSW advised Pt to call daily to check on bed availability.      ?

## 2021-06-20 NOTE — Progress Notes (Signed)
[facility] MD Progress Note ? ?06/20/2021 1:00 PM ?Martha Mercado  ?MRN:  [id number] ?Subjective:   ? ? ?On assessment this a.m. patient reports that if she is discharged she will automatically go to the liquor store.  Patient reports that she is still very insistent on killing herself be heroin overdose.  Patient reports that she did reach out to at least 2 of the 3 rehabilitation centers but was told that she will have to wait a minimum of 2 weeks.  Patient endorses at this time she does not think she will be able to survive 2 weeks to wait for rehab.  Patient endorses that she is sleeping well and eating well.  Patient reports that she does want to be sober but continues to be overwhelmed by stressors in her life.  Patient is able to list out her stressors: ?1.  Having 3 DUIs and no legal driver's license ?2.  Not having assault situation and endorses that this is likely what led to her frequent drug use because she does not want to think about her situation ?3.  The cost of living ?4.  Suicidal ideation ?5.  Drug use specifically needing alcohol to "work" and then having to use the cocaine to "stay up" ?6.  Losing her wallet that had her food stamp card and her official identification ? ?Patient reports that she feels that her lack of living stability is her largest stressor and not being sober.  Patient endorses that she would be willing to take a significant pay cut if it meant staying sober.  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  On assessment today patient endorses that she can keep herself safe while in the hospital denies HI and denies AVH.  Patient endorses that she does feel some benefit from her Zoloft 100 mg and is sleeping and eating well.  Patient denies having adverse side effects from her medication.  Patient endorses that she does not feel she is ready for discharge. ? ? ?Objectively, patient appears to have  slightly more affect on admission the patient was notably flat.  Patient does continue to be very serious about her SI and plan outside of the hospital. ?Principal Problem: Substance induced mood disorder (HCC) ?Diagnosis: Principal Problem: ?  Substance induced mood disorder (HCC) ?Active Problems: ?  Suicidal ideations ?  Alcohol abuse ?  Hallucinogen abuse (HCC) ?  Cannabis abuse ?  Cocaine abuse (HCC) ?  PTSD (post-traumatic stress disorder) ?  Social anxiety disorder ? ?Total Time spent with patient: 20 minutes ? ?Past Psychiatric History: See H&P ? ?Past Medical History:  ?Past Medical History:  ?Diagnosis Date  ? Dysmenorrhea   ? History reviewed. No pertinent surgical history. ?Family History:  ?Family History  ?Problem Relation Age of Onset  ? Hypertension Maternal Grandmother   ? Hypertension Maternal Grandfather   ? Diabetes Paternal Grandfather   ? Heart disease Paternal Grandfather   ? ?Family Psychiatric  History: See H&P ?Social History:  ?Social History  ? ?Substance and Sexual Activity  ?Alcohol Use Not Currently  ? Alcohol/week: 0.0 standard drinks  ?   ?Social History  ? ?Substance and Sexual Activity  ?Drug Use Yes  ? Types: Cocaine, Marijuana, Methamphetamines  ? Comment: last used meth and cocaine 2 years ago, still uses marijuana   ?  ?Social History  ? ?Socioeconomic History  ? Marital status: Single  ?  Spouse name: Not on file  ? Number of children: Not on file  ? Years of education: Not on file  ? Highest education level: Not on file  ?Occupational History  ? Not on file  ?Tobacco Use  ? Smoking status: Some Days  ?  Years: 3.00  ?  Types: Cigarettes  ? Smokeless tobacco: Never  ?Vaping Use  ? Vaping Use: Former  ?Substance and Sexual Activity  ? Alcohol use: Not Currently  ?  Alcohol/week: 0.0 standard drinks  ? Drug use: Yes  ?  Types: Cocaine, Marijuana, Methamphetamines  ?  Comment: last used meth and cocaine 2 years ago, still uses marijuana   ? Sexual activity: Yes  ?  Birth  control/protection: None  ?Other Topics Concern  ? Not on file  ?Social History Narrative  ? Not on file  ? ?Social Determinants of Health  ? ?Financial Resource Strain: Not on file  ?Food Insecurity: Not on file  ?Transportation Needs: Not on file  ?Physical Activity: Not on file  ?Stress: Not on file  ?Social Connections: Not on file  ? ?Additional Social History:  ?Specify valuables returned: see belonging sheet ?  ?  ?  ?  ?  ?  ?  ?  ?  ?  ? ?Sleep: Good ? ?Appetite:  Good ? ?Current Medications: ?Current Facility-Administered Medications  ?Medication Dose Route Frequency Provider Last Rate Last Admin  ? acetaminophen (TYLENOL) tablet 650 mg  650 mg Oral Q6H PRN Court Joy, PA-C      ? alum & mag hydroxide-simeth (MAALOX/MYLANTA) 200-200-20 MG/5ML suspension 30 mL  30 mL Oral Q4H PRN Court Joy, PA-C      ? folic acid (FOLVITE) tablet 1 mg  1 mg Oral Daily Court Joy, PA-C   1 mg at 06/20/21 5621  ? magnesium hydroxide (MILK OF MAGNESIA) suspension 30 mL  30 mL Oral Daily PRN Court Joy, PA-C      ? multivitamin with minerals tablet 1 tablet  1 tablet Oral Daily Bobbitt, Shalon E, NP   1 tablet at 06/20/21 3086  ? sertraline (ZOLOFT) tablet 100 mg  100 mg Oral Daily Eliseo Gum B, MD   100 mg at 06/20/21 5784  ? thiamine tablet 100 mg  100 mg Oral Daily Bobbitt, Shalon E, NP   100 mg at 06/20/21 6962  ? ? ?Lab Results:  ?Results for orders placed or performed during the hospital encounter of 06/16/21 (from the past 48 hour(s))  ?TSH     Status: None  ? Collection Time: 06/18/21  6:12 PM  ?Result Value Ref Range  ? TSH 0.798 0.350 - 4.500 uIU/mL  ?  Comment: Performed by a 3rd Generation assay with a functional sensitivity of <=0.01 uIU/mL. ?Performed at [facility], [address] [place], [place] [address] ?  ?RPR     Status: None  ? Collection Time: 06/18/21  6:12 PM  ?Result Value Ref Range  ? RPR Ser Ql NON REACTIVE NON REACTIVE  ?  Comment: Performed at [facility], [address] [place], [place] [address]  ?HIV Antibody (routine testing w rflx)     Status: None  ? Collection Time: 06/18/21  6:12 PM  ?Result Value Ref Range  ? HIV Screen 4th Generation wRfx Non Reactive Non Reactive  ?  Comment: Performed at [facility], [address], [place], [place] [address]  ? ? ?Blood Alcohol level:  ?Lab  Results  ?Component Value Date  ? ETH 17 (H) 06/15/2021  ? ETH <10 05/15/2020  ? ? ?Metabolic Disorder Labs: ?No results found for: HGBA1C, MPG ?No results found for: PROLACTIN ?No results found for: CHOL, TRIG, HDL, CHOLHDL, VLDL, LDLCALC ? ?Physical Findings: ?AIMS: Facial and Oral Movements ?Muscles of Facial Expression: None, normal ?Lips and Perioral Area: None, normal ?Jaw: None, normal ?Tongue: None, normal,Extremity Movements ?Upper (arms, wrists, hands, fingers): None, normal ?Lower (legs, knees, ankles, toes): None, normal, Trunk Movements ?Neck, shoulders, hips: None, normal, Overall Severity ?Severity of abnormal movements (highest score from questions above): None, normal ?Incapacitation due to abnormal movements: None, normal ?Patient's awareness of abnormal movements (rate only patient's report): No Awareness, Dental Status ?Current problems with teeth and/or dentures?: No ?Does patient usually wear dentures?: No  ?CIWA:  CIWA-Ar Total: 0 ?COWS:    ? ?Musculoskeletal: ?Strength & Muscle Tone: within normal limits ?Gait & Station: normal ?Patient leans: N/A ? ?Psychiatric Specialty Exam: ? ?Presentation  ?General Appearance: Casual ? ?Eye Contact:Fair ? ?Speech:Clear and Coherent ? ?Speech Volume:Normal ? ?Handedness:No data recorded ? ?Mood and Affect  ?Mood:Dysphoric ? ?Affect:Congruent ? ? ?Thought Process  ?Thought Processes:Linear ? ?Descriptions of Associations:Circumstantial ? ?Orientation:Full (Time, Place and Person) ? ?Thought Content:Logical ? ?History of Schizophrenia/Schizoaffective disorder:No ? ?Duration of Psychotic Symptoms:No data  recorded ?Hallucinations:Hallucinations: None ? ?Ideas of Reference:None ? ?Suicidal Thoughts:Suicidal Thoughts: Yes, Active (can  consent in the hospital) ?SI Active Intent and/or Plan: With Plan; Without Access to

## 2021-06-20 NOTE — Group Note (Signed)
Recreation Therapy Group Note ? ? ?Group Topic:Leisure Education  ?Group Date: 06/20/2021 ?Start Time: 0930 ?End Time: 1000 ?Facilitators: Caroll Rancher, LRT,CTRS ?Location:  Rooms ? ? ?Goal Area(s) Addresses:  ?Patient will review and complete packet supporting identification of healthy leisure and recreation activities.  ? ?Group Description: LRT provided pt a workbook reviewing leisure and its impact on emotional states and personal fulfillment. Pt to complete the packet in their room at their own pace. ? ? ?Participation Quality: Independent ?  ? ?Clinical Observations/Individualized Feedback: Due to illness on the unit, patients were given packets dealing with leisure education and its impact on individual wellbeing.   ? ? ?Plan: Continue to engage patient in RT group sessions 2-3x/week. ? ? ?Caroll Rancher, LRT,CTRS ?06/20/2021 12:17 PM ?

## 2021-06-20 NOTE — Progress Notes (Signed)
?   06/20/21 2150  ?Psych Admission Type (Psych Patients Only)  ?Admission Status Voluntary  ?Psychosocial Assessment  ?Patient Complaints Anxiety;Depression  ?Eye Contact Fair  ?Facial Expression Anxious;Animated  ?Affect Appropriate to circumstance  ?Speech Logical/coherent  ?Interaction Minimal  ?Motor Activity Slow  ?Appearance/Hygiene Unremarkable  ?Behavior Characteristics Cooperative;Appropriate to situation  ?Mood Pleasant  ?Thought Process  ?Coherency WDL  ?Content WDL  ?Delusions None reported or observed  ?Perception WDL  ?Hallucination None reported or observed  ?Judgment Poor  ?Confusion None  ?Danger to Self  ?Current suicidal ideation? Denies  ?Self-Injurious Behavior No self-injurious ideation or behavior indicators observed or expressed   ?Agreement Not to Harm Self Yes  ?Description of Agreement Verbal contract  ?Danger to Others  ?Danger to Others None reported or observed  ? ? ?

## 2021-06-21 DIAGNOSIS — F1994 Other psychoactive substance use, unspecified with psychoactive substance-induced mood disorder: Principal | ICD-10-CM

## 2021-06-21 MED ORDER — SERTRALINE HCL 100 MG PO TABS: 100.0000 mg | ORAL_TABLET | Freq: Every day | ORAL | 0 refills | Status: AC

## 2021-06-21 NOTE — Discharge Summary (Addendum)
Physician Discharge Summary Note ? ?Patient:  Martha Mercado is an 26 y.o., female ?MRN:  [id number] ?DOB:  05-18-1995 ?Patient phone:  [phone] (home)  ?Patient address:   ?[address] ?[place] [address],  ?Total Time spent with patient: 20 minutes ? ?Date of Admission:  06/16/2021 ?Date of Discharge: 06/21/2021 ? ?Reason for Admission:  SI w/ plan ? ?Principal Problem: Substance induced mood disorder (HCC) ?Discharge Diagnoses: Principal Problem: ?  Substance induced mood disorder (HCC) ?Active Problems: ?  Suicidal ideations ?  Alcohol abuse ?  Hallucinogen abuse (HCC) ?  Cannabis abuse ?  Cocaine abuse (HCC) ?  PTSD (post-traumatic stress disorder) ?  Social anxiety disorder ? ? 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  ?No med hx ?NO OUTPT or Therapy hx ? ?Past Medical History:  ?Past Medical History:  ?Diagnosis Date  ? Dysmenorrhea   ? History reviewed. No pertinent surgical history. ?Family History:  ?Family History  ?Problem Relation Age of Onset  ? Hypertension Maternal Grandmother   ? Hypertension Maternal Grandfather   ? Diabetes Paternal Grandfather   ? Heart disease Paternal Grandfather   ? ?Family Psychiatric  History:  Denies ?Social History:  ?Social History  ? ?Substance and Sexual Activity  ?Alcohol Use Not Currently  ? Alcohol/week: 0.0 standard drinks  ?   ?Social History  ? ?Substance and Sexual Activity  ?Drug Use Yes  ? Types: Cocaine, Marijuana, Methamphetamines  ? Comment: last used meth and cocaine 2 years ago, still uses marijuana   ?  ?Social History  ? ?Socioeconomic History  ? Marital status: Single  ?  Spouse name: Not on file  ? Number of children: Not on file  ? Years of education: Not on file  ? Highest education level: Not on file  ?Occupational History  ? Not on file  ?Tobacco Use  ? Smoking status: Some Days  ?  Years: 3.00  ?  Types: Cigarettes  ? Smokeless tobacco: Never  ?Vaping Use  ? Vaping Use: Former   ?Substance and Sexual Activity  ? Alcohol use: Not Currently  ?  Alcohol/week: 0.0 standard drinks  ? Drug use: Yes  ?  Types: Cocaine, Marijuana, Methamphetamines  ?  Comment: last used meth and cocaine 2 years ago, still uses marijuana   ? Sexual activity: Yes  ?  Birth control/protection: None  ?Other Topics Concern  ? Not on file  ?Social History Narrative  ? Not on file  ? ?Social Determinants of Health  ? ?Financial Resource Strain: Not on file  ?Food Insecurity: Not on file  ?Transportation Needs: Not on file  ?Physical Activity: Not on file  ?Stress: Not on file  ?Social Connections: Not on file  ? ? ?Hospital Course:   ?  ?Martha Mercado is a 26 yo patient w/ PPH of Polysubstance abuse who transferred from [facility] after presenting endorsing N/V after use of Etoh and substances she also endorsed SI on assessment. Patient was transferred to [facility] after being declared medical stable. ? ?During the patient's hospitalization, patient had extensive initial psychiatric evaluation, and follow-up psychiatric evaluations every day. ?  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  ?Patient's psychiatric medications were adjusted on admission:  ?- Start on Zyprexa 5mg  for sleep ?  ?During the hospitalization, other adjustments were made to the patient's psychiatric medication regimen:  ?- Decreased Zyprexa to 2.5mg  QHS and  then discontinued was oversedating and patient was not psychotic  ?- Start Zoloft 100mg  daily ?- Patient also took Trazodone 50mg  PRN nightly ?- Patient endorsed taking Hydroxyzine 25mg  for anxiety, but it made it difficult for patient to sleep ?  ?Gradually, patient started adjusting to milieu.   ?Patient's care was discussed during the interdisciplinary team meeting every day during the hospitalization. ?  ?The patient denied having side effects to psychiatric medications prescribed at discharge. ?  ?The patient  reports their target psychiatric symptoms of depression decreased during hospitalization, patient endorsed that she was a 10/10 in regards to her depression and then 6/10 by discharge. Patient SI became more passive and patient made some effort to find rehabilitation centers during hospitalization. Supportive psychotherapy was provided to the patient.  ?  ?Labs were reviewed with the patient, and abnormal results were discussed with the patient. ?  ?The patient denied having active suicidal thoughts prior to discharge.  Patient denies having homicidal thoughts.  Patient denies having auditory hallucinations.  Patient denies any visual hallucinations.  Patient denies having paranoid thoughts. Patient endorsed that having unstable housing was her most plaguing concern and her 3 DUIs. Patient continued to consider Suicide an options, but at time of discharge was no longer saying "I'm going to do it." ?  ?The patient is able to verbalize their individual safety plan to this provider. ?  ?It is recommended to the patient to continue psychiatric medications as prescribed, after discharge from the hospital.   ?  ?It is recommended to the patient to follow up with your outpatient psychiatric provider and PCP. ?  ?Discussed with the patient, the impact of alcohol, drugs, tobacco have been there overall psychiatric and medical wellbeing, and total abstinence from substance use was recommended the patient.  ?  ? ? ? ?[facility]. [facility] reported that patient had warrants in 2 different counties and would need to post bail to be released from jail, but she would also have to present before 2 county judges due to her misdeamors being in 2 different counties, likely prolonging the time it would take for patient to leave jail. [facility] also endorsed it would be very difficult for them to come back to get patient due to the fact that we became aware today that patient has pending warrants. It appears that this is  the safest discharge option for patient. Patient was aware that she has pending charges and was made aware that because she has pending charges and her insurance her rehabs choices were essentially none for door to door. Patient had called [facility] and that there was no bed available until approx May. Patient had no family or friends that we could contact that would be able to make sure patient could stay safe and would not act on her previous plan, despite endorsing that her depression was improved. Patient did become less linear in her thoughts, but at the point that staff and patient became aware that patient's charges would make it difficult for her to get rehab, and county officers reaches out jail became the safest option for discharge for patient as she will be monitored and have no access to illicit substances.  ? ?Patient did make contact with [facility] and a referral was made. Due to [facility]'s moving, she has the opportunity to call back later to do the fill screening. Patient also has a phone [facility] for her with [facility].  ? ?Physical Findings: ?AIMS: Facial and  Oral Movements ?Muscles of Facial Expression: None, normal ?Lips and Perioral Area: None, normal ?Jaw: None, normal ?Tongue: None, normal,Extremity Movements ?Upper (arms, wrists, hands, fingers): None, normal ?Lower (legs, knees, ankles, toes): None, normal, Trunk Movements ?Neck, shoulders, hips: None, normal, Overall Severity ?Severity of abnormal movements (highest score from questions above): None, normal ?Incapacitation due to abnormal movements: None, normal ?Patient's awareness of abnormal movements (rate only patient's report): No Awareness, Dental Status ?Current problems with teeth and/or dentures?: No ?Does patient usually wear dentures?: No  ?CIWA:  CIWA-Ar Total: 0 ?COWS:    ? ?Musculoskeletal: ?Strength & Muscle Tone: within normal limits ?Gait & Station: normal ?Patient leans: N/A ? ? ?Psychiatric Specialty  Exam: ? ?Presentation  ?General Appearance: Appropriate for Environment; Casual (hair is redone) ? ?Eye Contact:-- (avoidant) ? ?Speech:Clear and Coherent ? ?Speech Volume:Normal ? ?Handedness:No data recorded ? ?Mood a

## 2021-06-21 NOTE — Progress Notes (Signed)
Discharge Note:  Patient discharged with three policemen.  [facility] security guard also in locker room with RN and patient.  Patient was very calm.  Nurse offered to walk out with patient also, patient declined.  Patient was calm and composed.  Patient  agreed that all her belongings, clothes, shoes, phone, money, small bag were her belongings.  ?Policemen walked patient out of [facility] building. ?Respirations even and unlabored.  No signs/symptoms of pain/distress noted on patient's face/body movements. ? ?

## 2021-06-21 NOTE — Plan of Care (Signed)
Nurse discussed coping skills with patient.  

## 2021-06-21 NOTE — [facility] Suicide Risk Assessment (Signed)
Suicide Risk Assessment ? ?Discharge Assessment    ?[facility] Discharge Suicide Risk Assessment ? ? ?Principal Problem: Substance induced mood disorder (HCC) ?Discharge Diagnoses: Principal Problem: ?  Substance induced mood disorder (HCC) ?Active Problems: ?  Suicidal ideations ?  Alcohol abuse ?  Hallucinogen abuse (HCC) ?  Cannabis abuse ?  Cocaine abuse (HCC) ?  PTSD (post-traumatic stress disorder) ?  Social anxiety disorder ? ? ?Total Time spent with patient: 30 minutes ? ?During the patient's hospitalization, patient had extensive initial psychiatric evaluation, and follow-up psychiatric evaluations every day. ? 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 ?Patient's psychiatric medications were adjusted on admission:  ?- Start on Zyprexa 5mg  for sleep ? ?During the hospitalization, other adjustments were made to the patient's psychiatric medication regimen:  ?- Decreased Zyprexa to 2.5mg  QHS and then discontinued was oversedating and patient was not psychotic  ?- Start Zoloft 100mg  daily ?- Patient also took Trazodone 50mg  PRN nightly ?- Patient endorsed taking Hydroxyzine 25mg  for anxiety, but it made it difficult for patient to sleep ? ?Gradually, patient started adjusting to milieu.   ?Patient's care was discussed during the interdisciplinary team meeting every day during the hospitalization. ? ?The patient denied having side effects to psychiatric medications prescribed at discharge. ? ?The patient reports their target psychiatric symptoms of depression decreased during hospitalization, patient endorsed that she was a 10/10 in regards to her depression and then 6/10 by discharge. Patient SI became more passive and patient made some effort to find rehabilitation centers during hospitalization. Supportive psychotherapy was provided to the patient.  ? ?Labs were reviewed with the patient, and abnormal results were discussed  with the patient. ? ?The patient denied having active suicidal thoughts prior to discharge.  Patient denies having homicidal thoughts.  Patient denies having auditory hallucinations.  Patient denies any visual hallucinations.  Patient denies having paranoid thoughts. Patient endorsed that having unstable housing was her most plaguing concern and her 3 DUIs. Patient continued to consider Suicide an options, but at time of discharge was no longer saying "I'm going to do it." ? ?The patient is able to verbalize their individual safety plan to this provider. ? ?It is recommended to the patient to continue psychiatric medications as prescribed, after discharge from the hospital.   ? ?It is recommended to the patient to follow up with your outpatient psychiatric provider and PCP. ? ?Discussed with the patient, the impact of alcohol, drugs, tobacco have been there overall psychiatric and medical wellbeing, and total abstinence from substance use was recommended the patient.  ? ?Musculoskeletal: ?Strength & Muscle Tone: within normal limits ?Gait & Station: normal ?Patient leans: N/A ? ?Psychiatric Specialty Exam ? ?Presentation  ?General Appearance: Appropriate for Environment; Casual (hair is redone) ? ?Eye Contact:-- (avoidant) ? ?Speech:Clear and Coherent ? ?Speech Volume:Normal ? ?Handedness:No data recorded ? ?Mood and Affect  ?Mood:Dysphoric ? ?Duration of Depression Symptoms: Greater than two weeks ? ?Affect:Congruent (guarded, near tears) ? ? ?Thought Process  ?Thought Processes:Goal Directed ? ?Descriptions of Associations:Circumstantial ? ?Orientation:Full (Time, Place and Person) ? ?Thought Content:Logical ? ?History of Schizophrenia/Schizoaffective disorder:No ? ?Duration of Psychotic Symptoms:No data recorded ?Hallucinations:Hallucinations: None ? ?Ideas of Reference:None ? ?Suicidal Thoughts:Suicidal Thoughts: Yes, Passive ?SI Active Intent and/or Plan: With Plan ? ?Homicidal Thoughts:Homicidal Thoughts:  No ? ? ?Sensorium  ?Memory:Immediate Good; Recent Good ? ?Judgment:Poor ? ?Insight:Shallow ? ? ?Executive Functions  ?Concentration:Fair ? ?Attention Span:Fair ? ?Recall:Fair ? ?Fund of  Knowledge:Fair ? ?Language:Fair ? ? ?Psychomotor Activity  ?Psychomotor Activity:Psychomotor Activity: Psychomotor Retardation ? ? ?Assets  ?Assets:Communication Skills ? ? ?Sleep  ?Sleep:Sleep: Fair ? ? ?Physical Exam: ?Physical Exam ?Constitutional:   ?   Appearance: Normal appearance.  ?HENT:  ?   Head: Normocephalic and atraumatic.  ?Pulmonary:  ?   Effort: Pulmonary effort is normal.  ?Neurological:  ?   Mental Status: She is alert and oriented to person, place, and time.  ? ?Review of Systems  ?Psychiatric/Behavioral:  Negative for hallucinations and suicidal ideas. The patient does not have insomnia.   ?Blood pressure 115/73, pulse 79, temperature 97.7 ?F (36.5 ?C), resp. rate 16, height 5\' 8"  (1.727 m), weight 70.3 kg, SpO2 100 %. Body mass index is 23.57 kg/m?. ? ?Mental Status Per Nursing Assessment::   ?On Admission:  Suicidal ideation indicated by patient ? ?Demographic Factors:  ?Adolescent or young adult and Living alone ? ?Loss Factors: ?Legal issues ? ?Historical Factors: ?NA ? ?Risk Reduction Factors:   ?NA ? ?Continued Clinical Symptoms:  ?Depression:   Hopelessness ? ?Cognitive Features That Contribute To Risk:  ?None   ? ?Suicide Risk:  ?Moderate:  Frequent suicidal ideation with limited intensity, and duration, some specificity in terms of plans, no associated intent, good self-control, limited dysphoria/symptomatology, some risk factors present, and identifiable protective factors, including available and accessible social support. ? ? Follow-up Information   ? ? [facility] Follow up on 07/13/2021.   ?Why: You have an appointment for medication management services on 07/13/21 at 10:00 am.   This appointment will be Virtual. ?Contact information: ?[address] ?[address] ?[place]  [place] ?[phone] ? ? ?  ?  ? ? [facility] Follow up.   ?Specialty: Addiction Medicine ?Why: Referral made ?Contact information: ?[address] ?[address] [place] ?[phone] ? ? ?  ?  ? ? [facility], [facility] Follow up.   ?Why: Referral made ?Contact information: ?[address] [place] ?[address] ? ? ?  ?  ? ? Center, [phone] [facility] Follow up on 06/28/2021.   ?Why: You have an appointment for therapy services on 06/28/21 at 10:00 am.   This appointment will be Virtual. ?Contact information: ?[address], [place], [place] ?[place] ?[phone] ? ? ?  ?  ? ?  ?  ? ?  ? ? ?Plan Of Care/Follow-up recommendations:  ?Patient is instructed to take all prescribed medications as recommended. Report any side effects or adverse reactions to your outpatient psychiatrist. Patient is instructed to abstain from alcohol and illegal drugs while on prescription medications. In the event of worsening symptoms, patient is instructed to call the crisis hotline, 911, 988, or go to [facility], or go to the nearest emergency department for evaluation and treatment.   ? ? ? ?PGY-2 ?[facility], MD ?06/21/2021, 10:23 AM ?

## 2021-06-21 NOTE — Progress Notes (Signed)
?  [facility] Adult Case Management Discharge Plan : ? ?Will you be returning to the same living situation after discharge:  No. ?At discharge, do you have transportation home?: Yes,  Community  ?Do you have the ability to pay for your medications: Yes,  Medicaid  ? ?Release of information consent forms completed and in the chart;  Patient's signature needed at discharge. ? ?Patient to Follow up at: ? Follow-up Information   ? ? [facility] Follow up on 07/13/2021.   ?Why: You have an appointment for medication management services on 07/13/21 at 10:00 am.   This appointment will be Virtual. ?Contact information: ?[address] ?[address] ?[place] [address] ?[phone] ? ? ?  ?  ? ? [facility] Follow up.   ?Specialty: Addiction Medicine ?Why: Referral made ?Contact information: ?[address] ?[place] [address] ?[phone] ? ? ?  ?  ? ? [facility] Follow up.   ?Why: Referral made ?Contact information: ?[address] [place] [address] ?[phone] ? ? ?  ?  ? ? [facility], [facility] Follow up on 06/28/2021.   ?Why: You have an appointment for therapy services on 06/28/21 at 10:00 am.   This appointment will be Virtual. ?Contact information: ?[address], [place], [place] ?[place] [address] ?[phone] ? ? ?  ?  ? ?  ?  ? ?  ? ? ?Next level of care provider has access to [facility] Link:no ? ?Safety Planning and Suicide Prevention discussed: Yes,  with patient  ? ?  ? ?Has patient been referred to the Quitline?: Patient refused referral ? ?Patient has been referred for addiction treatment: Yes ? ?Aram Beecham, LCSWA ?06/21/2021, 10:37 AM ?

## 2021-06-21 NOTE — [facility] Counselor (Addendum)
06/20/21, 4:00pm - CSW checked the court date website [employer]) and discovered that the Pt is an Media planner (which means that the Pt is currently on probation and has missed meetings with their probation officer and has a warrant for their arrest) and has outstanding charges out of [address] [place].  CSW informed the Providers of this information.  ? ?06/21/21, 9:45am - CSW contacted [facility] and was informed that the Pt has warrants for arrest due to outstanding charges in [place] and [employer].  CSW contacted the [facility] department to obtain a Duty to Release/Disclsoe form.  CSW is awaiting a return call.   ? ?06/21/21, 10:05am - CSW spoke with the Pt, along with Dr. Morrie Sheldon and Dr. Loleta Chance.  CSW asked the Pt if she had any upcoming court dates or pending charges.  The Pt confirmed that she does.  CSW explained that due to her insurance most of her inpatient SA options would limited and that these centers would likely deny her due to those charges and court dates.  CSW asked the Pt what her thoughts were about outpatient resources.  The Pt asked questions about outpatient resources and CSW explained what would be available to the Pt.  The Pt stated that she has a friend that would let her live with them after discharge.  CSW spoke with the Pt's nurse to help the Pt get her phone out of her locker to get that friend's phone number.  The Pt stated that she is open to outpatient options and that she understands that she will need to take care of her court dates prior to going to inpatient treatment.  CSW has provided the Pt with a list of shelters and housing resources, as well as a list of inpatient treatment centers within the state that do accept her insurance.  Referrals to [facility] and [facility] have also been placed as well.   ? ?06/21/21, 10:20am - CSW was informed that the Police were in the waiting area.  CSW spoke with the [facility] who state that they are  here to pick up the Pt now.  They state that due to the Pt's charges being Misdemeanors they will be unable to get a Duty to Release/Disclose form at this time.  They state that they are not certain that they would be able to come back to arrest her if she was to be discharged at a later date due to HIPAA laws since they are unable to get the Duty to Release/Disclose form. They state that the Pt does have warrants for her arrest out of multiple counties.  They state if the Pt is being discharge from the hospital today then they can arrest her since they are already at the facility.  CSW informed the providers of this information.  The providers state that due to the Pt being unable to get into a residential treatment facility due to her charges they feel that this will be the safest discharge plan for the Pt at this time.  The Pt will be released to the care of the [facility].  Appropriate follow-up has been placed in the Pt's chart.  ?

## 2021-07-16 DIAGNOSIS — Z419 Encounter for procedure for purposes other than remedying health state, unspecified: Secondary | ICD-10-CM | POA: Diagnosis not present

## 2021-08-16 DIAGNOSIS — Z419 Encounter for procedure for purposes other than remedying health state, unspecified: Secondary | ICD-10-CM | POA: Diagnosis not present

## 2021-09-15 DIAGNOSIS — Z419 Encounter for procedure for purposes other than remedying health state, unspecified: Secondary | ICD-10-CM | POA: Diagnosis not present

## 2021-10-16 DIAGNOSIS — Z419 Encounter for procedure for purposes other than remedying health state, unspecified: Secondary | ICD-10-CM | POA: Diagnosis not present

## 2021-10-30 DIAGNOSIS — Z114 Encounter for screening for human immunodeficiency virus [HIV]: Secondary | ICD-10-CM | POA: Diagnosis not present

## 2021-10-30 DIAGNOSIS — Z113 Encounter for screening for infections with a predominantly sexual mode of transmission: Secondary | ICD-10-CM | POA: Diagnosis not present

## 2021-10-30 DIAGNOSIS — N760 Acute vaginitis: Secondary | ICD-10-CM | POA: Diagnosis not present

## 2021-11-16 DIAGNOSIS — Z419 Encounter for procedure for purposes other than remedying health state, unspecified: Secondary | ICD-10-CM | POA: Diagnosis not present

## 2021-12-16 DIAGNOSIS — Z419 Encounter for procedure for purposes other than remedying health state, unspecified: Secondary | ICD-10-CM | POA: Diagnosis not present

## 2022-01-13 ENCOUNTER — Encounter (HOSPITAL_COMMUNITY): Payer: Self-pay

## 2022-01-13 ENCOUNTER — Emergency Department (HOSPITAL_COMMUNITY)
Admission: EM | Admit: 2022-01-13 | Discharge: 2022-01-14 | Discharge status: Against Medical Advice | Payer: Medicaid Other

## 2022-01-13 ENCOUNTER — Emergency Department (HOSPITAL_COMMUNITY): Payer: Medicaid Other

## 2022-01-13 ENCOUNTER — Emergency Department (HOSPITAL_COMMUNITY)
Admission: EM | Admit: 2022-01-13 | Discharge: 2022-01-13 | Disposition: A | Discharge status: Home / Self Care | Payer: Medicaid Other | Attending: Emergency Medicine | Admitting: Emergency Medicine

## 2022-01-13 ENCOUNTER — Other Ambulatory Visit: Payer: Self-pay

## 2022-01-13 DIAGNOSIS — E876 Hypokalemia: Secondary | ICD-10-CM | POA: Diagnosis not present

## 2022-01-13 DIAGNOSIS — R404 Transient alteration of awareness: Secondary | ICD-10-CM | POA: Diagnosis not present

## 2022-01-13 DIAGNOSIS — R0689 Other abnormalities of breathing: Secondary | ICD-10-CM | POA: Diagnosis not present

## 2022-01-13 DIAGNOSIS — T401X1A Poisoning by heroin, accidental (unintentional), initial encounter: Secondary | ICD-10-CM

## 2022-01-13 DIAGNOSIS — R000 Tachycardia, unspecified: Secondary | ICD-10-CM | POA: Diagnosis not present

## 2022-01-13 DIAGNOSIS — R55 Syncope and collapse: Secondary | ICD-10-CM | POA: Diagnosis not present

## 2022-01-13 DIAGNOSIS — X58XXXA Exposure to other specified factors, initial encounter: Secondary | ICD-10-CM | POA: Diagnosis not present

## 2022-01-13 DIAGNOSIS — Z743 Need for continuous supervision: Secondary | ICD-10-CM | POA: Diagnosis not present

## 2022-01-13 DIAGNOSIS — T50901A Poisoning by unspecified drugs, medicaments and biological substances, accidental (unintentional), initial encounter: Secondary | ICD-10-CM | POA: Diagnosis not present

## 2022-01-13 DIAGNOSIS — R9431 Abnormal electrocardiogram [ECG] [EKG]: Secondary | ICD-10-CM | POA: Diagnosis not present

## 2022-01-13 LAB — I-STAT BETA HCG BLOOD, ED (MC, WL, AP ONLY): I-stat hCG, quantitative: <5 m[IU]/mL (ref <5)

## 2022-01-13 LAB — BASIC METABOLIC PANEL
Anion gap: 6 (ref 5–15)
BUN: 9 mg/dL (ref 6–20)
CO2: 25 mmol/L (ref 22–32)
Calcium: 8.1 mg/dL — ABNORMAL LOW (ref 8.9–10.3)
Chloride: 108 mmol/L (ref 98–111)
Creatinine, Ser: 0.79 mg/dL (ref 0.44–1.00)
GFR, Estimated: >60 mL/min (ref >60)
Glucose, Bld: 137 mg/dL — ABNORMAL HIGH (ref 70–99)
Potassium: 3.2 mmol/L — ABNORMAL LOW (ref 3.5–5.1)
Sodium: 139 mmol/L (ref 135–145)

## 2022-01-13 LAB — CBC
HCT: 39.8 % (ref 36.0–46.0)
Hemoglobin: 13.5 g/dL (ref 12.0–15.0)
MCH: 32.8 pg (ref 26.0–34.0)
MCHC: 33.9 g/dL (ref 30.0–36.0)
MCV: 96.8 fL (ref 80.0–100.0)
Platelets: 331 10*3/uL (ref 150–400)
RBC: 4.11 MIL/uL (ref 3.87–5.11)
RDW: 12.6 % (ref 11.5–15.5)
WBC: 7.9 10*3/uL (ref 4.0–10.5)
nRBC: 0 % (ref 0.0–0.2)

## 2022-01-13 LAB — CBG MONITORING, ED: Glucose-Capillary: 140 mg/dL — ABNORMAL HIGH (ref 70–99)

## 2022-01-13 MED ORDER — NALOXONE HCL 4 MG/0.1ML NA LIQD: NASAL | 1 refills | Status: AC

## 2022-01-13 MED ORDER — NALOXONE HCL 2 MG/2ML IJ SOSY
1.0000 mg | PREFILLED_SYRINGE | Freq: Once | INTRAMUSCULAR | Status: AC
Start: 2022-01-13 — End: 2022-01-13
  Administered 2022-01-13: 1 mg via INTRAVENOUS
  Filled 2022-01-13: qty 2

## 2022-01-13 MED ORDER — LACTATED RINGERS IV BOLUS
1000.0000 mL | Freq: Once | INTRAVENOUS | Status: AC
  Administered 2022-01-13: 1000 mL via INTRAVENOUS

## 2022-01-13 MED ORDER — POTASSIUM CHLORIDE CRYS ER 20 MEQ PO TBCR
40.0000 meq | EXTENDED_RELEASE_TABLET | Freq: Once | ORAL | Status: AC
  Administered 2022-01-13: 40 meq via ORAL
  Filled 2022-01-13: qty 2

## 2022-01-13 NOTE — ED Triage Notes (Signed)
BIBA  Per EMS: Pt arrives w/ c/o heroin OD from motel. Pt reportedly sorted heroin. Pt was agonal breathing. Bagged in field.  2.5 narcan given en route.  383mL fluids given en route.  148/88 98% RA 100HR

## 2022-01-13 NOTE — ED Provider Notes (Signed)
[facility] DEPT Provider Note   CSN: [id number] Arrival date & time: 01/13/22  1045     History  Chief Complaint  Patient presents with   Drug Overdose    Martha Mercado is a 26 y.o. female.   Drug Overdose     Patient has a history of methamphetamine use, adjustment disorder, alcohol abuse, PTSD who presents to the ED for evaluation of an accidental heroin overdose.  Patient reportedly was at a motel.  She snorted heroin.  EMS was called.  She was noted to have agonal breathing on arrival.  They brought a bag valve mask ventilation and gave her 2.5 mg of Narcan.  Patient states right now she is feeling thirsty.  She denies any intent to harm herself.  She denies any other ingestion  Home Medications Prior to Admission medications   Medication Sig Start Date End Date Taking? Authorizing Provider  naloxone [facility]) nasal spray 4 mg/0.1 mL Spray intranasally for accidental opiate overdose 01/13/22  Yes Dorie Rank, MD  sertraline (ZOLOFT) 100 MG tablet Take 1 tablet (100 mg total) by mouth daily. 06/21/21   Freida Busman, MD      Allergies    Patient has no known allergies.    Review of Systems   Review of Systems  Physical Exam Updated Vital Signs BP 124/76   Pulse 70   Temp 98.6 F (37 C) (Oral)   Resp (!) 21   SpO2 96%  Physical Exam Vitals and nursing note reviewed.  Constitutional:      Appearance: She is well-developed. She is not diaphoretic.     Comments: somewhat somnolent although will wake up when spoken to and answer questions appropriately  HENT:     Head: Normocephalic and atraumatic.     Right Ear: External ear normal.     Left Ear: External ear normal.  Eyes:     General: No scleral icterus.       Right eye: No discharge.        Left eye: No discharge.     Conjunctiva/sclera: Conjunctivae normal.  Neck:     Trachea: No tracheal deviation.  Cardiovascular:     Rate and Rhythm: Normal rate and regular rhythm.   Pulmonary:     Effort: Pulmonary effort is normal. No respiratory distress.     Breath sounds: Normal breath sounds. No stridor.  Abdominal:     General: There is no distension.  Musculoskeletal:        General: No swelling or deformity.     Cervical back: Neck supple.  Skin:    General: Skin is warm and dry.     Findings: No rash.  Neurological:     General: No focal deficit present.     Mental Status: She is alert.     Cranial Nerves: Cranial nerve deficit: no gross deficits.     ED Results / Procedures / Treatments   Labs (all labs ordered are listed, but only abnormal results are displayed) Labs Reviewed  BASIC METABOLIC PANEL - Abnormal; Notable for the following components:      Result Value   Potassium 3.2 (*)    Glucose, Bld 137 (*)    Calcium 8.1 (*)    All other components within normal limits  CBC  I-STAT BETA HCG BLOOD, ED ([facility], [facility], [facility] ONLY)    EKG EKG Interpretation  Date/Time:  Sunday January 13 2022 10:57:40 EDT Ventricular Rate:  78 PR Interval:  171  QRS Duration: 83 QT Interval:  382 QTC Calculation: 436 R Axis:   76 Text Interpretation: Sinus rhythm Confirmed by Linwood Dibbles [phone]) on 01/13/2022 11:14:43 AM  Radiology DG Chest Portable 1 View  Result Date: 01/13/2022 CLINICAL DATA:  Overdose. EXAM: PORTABLE CHEST 1 VIEW COMPARISON:  None Available. FINDINGS: The heart size and mediastinal contours are within normal limits. Both lungs are clear. The visualized skeletal structures are unremarkable. IMPRESSION: No active disease. Electronically Signed   By: Gerome Sam III M.D.   On: 01/13/2022 11:29    Procedures Procedures    Medications Ordered in ED Medications  naloxone [facility]) injection 1 mg (1 mg Intravenous Given 01/13/22 1134)  lactated ringers bolus 1,000 mL (1,000 mLs Intravenous New Bag/Given 01/13/22 1129)  potassium chloride SA (KLOR-CON M) CR tablet 40 mEq (40 mEq Oral Given 01/13/22 1331)    ED Course/ Medical Decision  Making/ A&P Clinical Course as of 01/13/22 1426  Sun Jan 13, 2022  1152 Patient is awake and alert.  Discussed with nursing staff and she did not receive the additional Narcan. [JK]  1153 Patient states she does not want to be here and would like to go home.  She states she did not want to harm herself.  I do not feel that she needs to be involuntary committed but I did ask her to stay a little longer to make sure she does not have any recurrent symptoms after the Narcan wears off [JK]  1254 CXR without acute findings [JK]  1254 CBC Nl  [JK]  1255 Basic metabolic panel(!) K decreased [JK]  1423 Patient is alert and awake.  Ready for discharge [JK]    Clinical Course User Index [JK] Linwood Dibbles, MD                           Medical Decision Making Problems Addressed: Accidental overdose of heroin, initial encounter [facility]): acute illness or injury that poses a threat to life or bodily functions  Amount and/or Complexity of Data Reviewed Labs: ordered. Decision-making details documented in ED Course. Radiology: ordered and independent interpretation performed.  Risk Prescription drug management.   Patient presented to the ED for evaluation after an accidental drug overdose.  Patient denied any plan to harm herself.  Patient was monitored in the ED and she had no recurrent somnolence or respiratory depression.  No signs of aspiration or pneumonia on x-ray.  Labs unremarkable with exception of slightly decreased potassium.  She was given oral replacement in the ED.  Patient appears [facility] for discharge.  We will give her a Narcan prescription.  Recommend outpatient follow-up with treatment centers for assistance in her substance use disorder        Final Clinical Impression(s) / ED Diagnoses Final diagnoses:  Accidental overdose of heroin, initial encounter (HCC)  Hypokalemia    Rx / DC Orders ED Discharge Orders          Ordered    naloxone (NARCAN) nasal spray 4 mg/0.1 mL         01/13/22 1156              Linwood Dibbles, MD 01/13/22 1426

## 2022-01-16 DIAGNOSIS — Z419 Encounter for procedure for purposes other than remedying health state, unspecified: Secondary | ICD-10-CM | POA: Diagnosis not present

## 2022-02-15 DIAGNOSIS — Z419 Encounter for procedure for purposes other than remedying health state, unspecified: Secondary | ICD-10-CM | POA: Diagnosis not present

## 2022-03-18 DIAGNOSIS — Z419 Encounter for procedure for purposes other than remedying health state, unspecified: Secondary | ICD-10-CM | POA: Diagnosis not present

## 2022-04-18 DIAGNOSIS — Z419 Encounter for procedure for purposes other than remedying health state, unspecified: Secondary | ICD-10-CM | POA: Diagnosis not present

## 2022-05-17 DIAGNOSIS — Z419 Encounter for procedure for purposes other than remedying health state, unspecified: Secondary | ICD-10-CM | POA: Diagnosis not present

## 2022-06-17 DIAGNOSIS — Z419 Encounter for procedure for purposes other than remedying health state, unspecified: Secondary | ICD-10-CM | POA: Diagnosis not present

## 2022-07-31 DIAGNOSIS — F322 Major depressive disorder, single episode, severe without psychotic features: Secondary | ICD-10-CM | POA: Diagnosis not present

## 2022-07-31 DIAGNOSIS — T1491XA Suicide attempt, initial encounter: Secondary | ICD-10-CM | POA: Diagnosis not present

## 2022-07-31 DIAGNOSIS — F332 Major depressive disorder, recurrent severe without psychotic features: Secondary | ICD-10-CM | POA: Diagnosis not present

## 2022-07-31 DIAGNOSIS — N898 Other specified noninflammatory disorders of vagina: Secondary | ICD-10-CM | POA: Diagnosis not present

## 2022-08-01 DIAGNOSIS — N898 Other specified noninflammatory disorders of vagina: Secondary | ICD-10-CM | POA: Diagnosis not present

## 2022-08-01 DIAGNOSIS — F332 Major depressive disorder, recurrent severe without psychotic features: Secondary | ICD-10-CM | POA: Diagnosis not present

## 2022-08-01 DIAGNOSIS — T1491XA Suicide attempt, initial encounter: Secondary | ICD-10-CM | POA: Diagnosis not present

## 2022-08-02 DIAGNOSIS — F332 Major depressive disorder, recurrent severe without psychotic features: Secondary | ICD-10-CM | POA: Diagnosis not present

## 2022-08-03 DIAGNOSIS — F332 Major depressive disorder, recurrent severe without psychotic features: Secondary | ICD-10-CM | POA: Diagnosis not present

## 2022-08-04 DIAGNOSIS — F332 Major depressive disorder, recurrent severe without psychotic features: Secondary | ICD-10-CM | POA: Diagnosis not present

## 2023-03-03 ENCOUNTER — Emergency Department (HOSPITAL_COMMUNITY): Payer: MEDICAID

## 2023-03-03 ENCOUNTER — Emergency Department (HOSPITAL_COMMUNITY)
Admission: EM | Admit: 2023-03-03 | Discharge: 2023-03-03 | Disposition: A | Discharge status: Home / Self Care | Payer: MEDICAID | Attending: Emergency Medicine | Admitting: Emergency Medicine

## 2023-03-03 DIAGNOSIS — S93492A Sprain of other ligament of left ankle, initial encounter: Secondary | ICD-10-CM | POA: Insufficient documentation

## 2023-03-03 DIAGNOSIS — W19XXXA Unspecified fall, initial encounter: Secondary | ICD-10-CM | POA: Diagnosis not present

## 2023-03-03 DIAGNOSIS — S99912A Unspecified injury of left ankle, initial encounter: Secondary | ICD-10-CM | POA: Diagnosis present

## 2023-03-03 MED ORDER — KETOROLAC TROMETHAMINE 15 MG/ML IJ SOLN
15.0000 mg | Freq: Once | INTRAMUSCULAR | Status: AC
  Administered 2023-03-03: 15 mg via INTRAMUSCULAR
  Filled 2023-03-03: qty 1

## 2023-03-03 MED ORDER — CELECOXIB 200 MG PO CAPS: 200.0000 mg | ORAL_CAPSULE | Freq: Two times a day (BID) | ORAL | 0 refills | Status: AC

## 2023-03-03 MED ORDER — ACETAMINOPHEN 500 MG PO TABS
1000.0000 mg | ORAL_TABLET | Freq: Once | ORAL | Status: AC
  Administered 2023-03-03: 1000 mg via ORAL
  Filled 2023-03-03: qty 2

## 2023-03-03 NOTE — Progress Notes (Signed)
Orthopedic Tech Progress Note Patient Details:  Martha Mercado [birth year] [id number]  Ortho Devices Type of Ortho Device: CAM walker, Crutches Ortho Device/Splint Location: LLE Ortho Device/Splint Interventions: Ordered, Application, Adjustment   Post Interventions Patient Tolerated: Well Instructions Provided: Care of device, Adjustment of device  Sherilyn Banker 03/03/2023, 7:55 PM

## 2023-03-03 NOTE — ED Notes (Signed)
Pt d/c per EDP order. Discharge summary reviewed, pt verbalizes understanding. NAD

## 2023-03-03 NOTE — ED Notes (Signed)
Ortho consulted °

## 2023-03-03 NOTE — ED Provider Notes (Signed)
[place] EMERGENCY DEPARTMENT AT [facility] Provider Note   CSN: [id number] Arrival date & time: 03/03/23  1533     History Chief Complaint  Patient presents with   Ankle Pain    HPI Martha Mercado is a 27 y.o. female presenting for left ankle pain.  Denies fevers chills nausea vomiting shortness of breath.  States that she went on a long walk 3 days ago had a ground-level fall where she fell over the left side.  Started having swelling on the lateral left side of her ankle.  Denies any other symptoms at this time..   Patient's recorded medical, surgical, social, medication list and allergies were reviewed in the Snapshot window as part of the initial history.   Review of Systems   Review of Systems  Constitutional:  Negative for chills and fever.  HENT:  Negative for ear pain and sore throat.   Eyes:  Negative for pain and visual disturbance.  Respiratory:  Negative for cough and shortness of breath.   Cardiovascular:  Negative for chest pain and palpitations.  Gastrointestinal:  Negative for abdominal pain and vomiting.  Genitourinary:  Negative for dysuria and hematuria.  Musculoskeletal:  Negative for arthralgias and back pain.  Skin:  Negative for color change and rash.  Neurological:  Negative for seizures and syncope.  All other systems reviewed and are negative.   Physical Exam Updated Vital Signs BP 109/81 (BP Location: Right Arm)   Pulse 83   Temp 98.2 F (36.8 C) (Oral)   Resp 19   SpO2 100%  Physical Exam Vitals and nursing note reviewed.  Constitutional:      General: She is not in acute distress.    Appearance: She is well-developed.  HENT:     Head: Normocephalic and atraumatic.  Eyes:     Conjunctiva/sclera: Conjunctivae normal.  Cardiovascular:     Rate and Rhythm: Normal rate and regular rhythm.     Heart sounds: No murmur heard. Pulmonary:     Effort: Pulmonary effort is normal. No respiratory distress.     Breath sounds:  Normal breath sounds.  Abdominal:     General: There is no distension.     Palpations: Abdomen is soft.     Tenderness: There is no abdominal tenderness. There is no right CVA tenderness or left CVA tenderness.  Musculoskeletal:        General: Tenderness (TTP across the lateral malleolus.) present. No swelling. Normal range of motion.     Cervical back: Neck supple.  Skin:    General: Skin is warm and dry.  Neurological:     General: No focal deficit present.     Mental Status: She is alert and oriented to person, place, and time. Mental status is at baseline.     Cranial Nerves: No cranial nerve deficit.      ED Course/ Medical Decision Making/ A&P    Procedures Procedures   Medications Ordered in ED Medications  acetaminophen (TYLENOL) tablet 1,000 mg (has no administration in time range)  ketorolac (TORADOL) 15 MG/ML injection 15 mg (has no administration in time range)    Medical Decision Making:   27 year old female with left ankle pain. Discussed supportive care.  X-ray provided given inability to walk for [place] ankle rules. No evidence of acute fracture on radiographic films.  Discussed supportive care.  Patient states she has no concern for pregnancy and is otherwise normal on her cycles therefore she is a candidate for NSAIDs.  Will refer to orthopedics for outpatient definitive care and management. Disposition:  I have considered need for hospitalization, however, considering all of the above, I believe this patient is stable for discharge at this time.  Patient/family educated about specific return precautions for given chief complaint and symptoms.  Patient/family educated about follow-up with PC.     Patient/family expressed understanding of return precautions and need for follow-up. Patient spoken to regarding all imaging and laboratory results and appropriate follow up for these results. All education provided in verbal form with additional information in  written form. Time was allowed for answering of patient questions. Patient discharged.    Emergency Department Medication Summary:   Medications  acetaminophen (TYLENOL) tablet 1,000 mg (has no administration in time range)  ketorolac (TORADOL) 15 MG/ML injection 15 mg (has no administration in time range)           Clinical Impression:  1. Sprain of anterior talofibular ligament of left ankle, initial encounter      Discharge   Final Clinical Impression(s) / ED Diagnoses Final diagnoses:  Sprain of anterior talofibular ligament of left ankle, initial encounter    Rx / DC Orders ED Discharge Orders          Ordered    celecoxib (CELEBREX) 200 MG capsule  2 times daily        03/03/23 1942              Glyn Ade, MD 03/03/23 1942

## 2023-03-03 NOTE — ED Triage Notes (Signed)
[place] [facility] [place] pain and swelling that started about an hour ago. Patient A&Ox4 and reports no injury or trauma, left ankle is swollen, warm, and tender. Patient reports she cannot bear weight at this time.

## 2024-02-13 ENCOUNTER — Encounter (HOSPITAL_COMMUNITY): Payer: Self-pay

## 2024-02-13 ENCOUNTER — Emergency Department (HOSPITAL_COMMUNITY)
Admission: EM | Admit: 2024-02-13 | Discharge: 2024-02-13 | Disposition: A | Discharge status: Home / Self Care | Attending: Emergency Medicine | Admitting: Emergency Medicine

## 2024-02-13 ENCOUNTER — Other Ambulatory Visit: Payer: Self-pay

## 2024-02-13 DIAGNOSIS — M79671 Pain in right foot: Secondary | ICD-10-CM | POA: Diagnosis present

## 2024-02-13 DIAGNOSIS — M79672 Pain in left foot: Secondary | ICD-10-CM | POA: Diagnosis not present

## 2024-02-13 NOTE — ED Triage Notes (Signed)
 PT BIB [facility]. Per EMS PT c/o nausea and bilateral foot pain. PT denies drug use and states that she took a hit from another persons Vape and didn't know what was in it. PT states that all symptoms started after vaping.

## 2024-02-13 NOTE — ED Provider Notes (Signed)
 [place] EMERGENCY DEPARTMENT AT [facility] Provider Note   CSN: [id number] Arrival date & time: 02/13/24  9862     Patient presents with: Nausea and Foot Pain   Martha Mercado is a 28 y.o. female.   28 yo F with a chief complaints of bilateral foot pain and erythema.  She thinks  this may be just started.  Just noticed it today.  Denies obvious injury.  Denies fevers.  Occurring on both feet but worse on the left than the right.   Foot Pain       Prior to Admission medications   Medication Sig Start Date End Date Taking? Authorizing Provider  celecoxib  (CELEBREX ) 200 MG capsule Take 1 capsule (200 mg total) by mouth 2 (two) times daily. 03/03/23   Jerral Meth, MD  naloxone  (NARCAN ) nasal spray 4 mg/0.1 mL Spray intranasally for accidental opiate overdose 01/13/22   Randol Simmonds, MD  sertraline  (ZOLOFT ) 100 MG tablet Take 1 tablet (100 mg total) by mouth daily. 06/21/21   McQuilla, Jai B, MD    Allergies: Patient has no known allergies.    Review of Systems  Updated Vital Signs BP 138/72 (BP Location: Right Arm)   Pulse 83   Temp 98.7 F (37.1 C) (Oral)   Resp 20   LMP  (LMP Unknown)   SpO2 100%   Physical Exam Vitals and nursing note reviewed.  Constitutional:      General: She is not in acute distress.    Appearance: She is well-developed. She is not diaphoretic.  HENT:     Head: Normocephalic and atraumatic.  Eyes:     Pupils: Pupils are equal, round, and reactive to light.  Cardiovascular:     Rate and Rhythm: Normal rate and regular rhythm.     Heart sounds: No murmur heard.    No friction rub. No gallop.  Pulmonary:     Effort: Pulmonary effort is normal.     Breath sounds: No wheezing or rales.  Abdominal:     General: There is no distension.     Palpations: Abdomen is soft.     Tenderness: There is no abdominal tenderness.  Musculoskeletal:        General: No tenderness.     Cervical back: Normal range of motion and neck  supple.     Comments: Erythema mostly to the plantar aspect of the foot that extends over to the tips of the toes with some mild blistering along the left great toe.  Warm to touch.  Cap refill less than 2 seconds.  Pulse motor and sensation intact.  Skin:    General: Skin is warm and dry.  Neurological:     Mental Status: She is alert and oriented to person, place, and time.  Psychiatric:        Behavior: Behavior normal.     (all labs ordered are listed, but only abnormal results are displayed) Labs Reviewed - No data to display  EKG: None  Radiology: No results found.   Procedures   Medications Ordered in the ED - No data to display                                  Medical Decision Making  27 yoF with a history of narcotic abuse comes in with a chief complaints of bilateral foot pain.  I think the patient likely has cold injury to her  feet.  Currently they are warm and dry.  Will try to provide some socks here.  Encouraged outpatient follow-up.  2:26 AM:  I have discussed the diagnosis/risks/treatment options with the patient.  Evaluation and diagnostic testing in the emergency department does not suggest an emergent condition requiring admission or immediate intervention beyond what has been performed at this time.  They will follow up with PCP. We also discussed returning to the ED immediately if new or worsening sx occur. We discussed the sx which are most concerning (e.g., sudden worsening pain, fever, inability to tolerate by mouth) that necessitate immediate return. Medications administered to the patient during their visit and any new prescriptions provided to the patient are listed below.  Medications given during this visit Medications - No data to display   The patient appears reasonably screen and/or stabilized for discharge and I doubt any other medical condition or other [facility] requiring further screening, evaluation, or treatment in the ED at this time prior to  discharge.       Final diagnoses:  Bilateral foot pain    ED Discharge Orders     None          Emil Share, DO 02/13/24 0226

## 2024-02-13 NOTE — Discharge Instructions (Signed)
 You need to try your best to keep your feet dry and warm.  I know that this is not easy.  Please help with your family doctor in the office.  I have given you information for a provider that sees people that have trouble finding housing.

## 2024-02-13 NOTE — ED Notes (Signed)
 Went to give patient discharge papers. Patient was not happy. I explained to the patient that this writer does not have anything to do with the discharge or care she receives that is up to the doctor. WE provided her with food, and socks. Gave her resources in paperwork. Patient stated that we are discriminating against her because she is homeless and said Ma'am that is not the case. Patient said ok and is finishing eating her sandwich.
